# Patient Record
Sex: Female | Born: 1955 | Race: Black or African American | Hispanic: No | Smoking: Former smoker
Health system: Southern US, Community
[De-identification: ages and names within clinical notes are randomized; demographics above are authoritative.]

## PROBLEM LIST (undated history)

## (undated) DIAGNOSIS — I10 Essential (primary) hypertension: Secondary | ICD-10-CM

## (undated) DIAGNOSIS — E119 Type 2 diabetes mellitus without complications: Secondary | ICD-10-CM

## (undated) HISTORY — DX: Type 2 diabetes mellitus without complications: E11.9

## (undated) HISTORY — DX: Essential (primary) hypertension: I10

---

## 2011-03-24 HISTORY — PX: GALLBLADDER SURGERY: SHX652

## 2016-06-16 DIAGNOSIS — IMO0001 Reserved for inherently not codable concepts without codable children: Secondary | ICD-10-CM | POA: Insufficient documentation

## 2020-05-03 ENCOUNTER — Ambulatory Visit (INDEPENDENT_AMBULATORY_CARE_PROVIDER_SITE_OTHER): Payer: Self-pay | Admitting: Physician Assistant

## 2020-05-03 ENCOUNTER — Other Ambulatory Visit: Payer: Self-pay

## 2020-05-03 ENCOUNTER — Encounter: Payer: Self-pay | Admitting: Physician Assistant

## 2020-05-03 VITALS — BP 136/86 | HR 90 | Temp 97.9°F | Resp 16 | Ht 64.5 in | Wt 150.4 lb

## 2020-05-03 DIAGNOSIS — E119 Type 2 diabetes mellitus without complications: Secondary | ICD-10-CM

## 2020-05-03 DIAGNOSIS — E2839 Other primary ovarian failure: Secondary | ICD-10-CM | POA: Diagnosis not present

## 2020-05-03 DIAGNOSIS — R5383 Other fatigue: Secondary | ICD-10-CM | POA: Diagnosis not present

## 2020-05-03 DIAGNOSIS — M25511 Pain in right shoulder: Secondary | ICD-10-CM | POA: Diagnosis not present

## 2020-05-03 DIAGNOSIS — Z1231 Encounter for screening mammogram for malignant neoplasm of breast: Secondary | ICD-10-CM | POA: Diagnosis not present

## 2020-05-03 DIAGNOSIS — Z1211 Encounter for screening for malignant neoplasm of colon: Secondary | ICD-10-CM

## 2020-05-03 DIAGNOSIS — Z7689 Persons encountering health services in other specified circumstances: Secondary | ICD-10-CM

## 2020-05-03 DIAGNOSIS — I10 Essential (primary) hypertension: Secondary | ICD-10-CM | POA: Diagnosis not present

## 2020-05-03 DIAGNOSIS — Z13820 Encounter for screening for osteoporosis: Secondary | ICD-10-CM

## 2020-05-03 LAB — POCT GLYCOSYLATED HEMOGLOBIN (HGB A1C): Hemoglobin A1C: 6 % — AB (ref 4.0–5.6)

## 2020-05-03 MED ORDER — MELOXICAM 15 MG PO TABS: 15.0000 mg | ORAL_TABLET | Freq: Every day | ORAL | 0 refills | Status: DC

## 2020-05-03 NOTE — Progress Notes (Signed)
[facility] [address] [place], [place] [address]  Internal MEDICINE  Office Visit Note  Patient Name: Adrienne Brown  [id number]  [id number]  Date of Service: 05/09/2020   Complaints/HPI Pt is here for establishment of PCP. Chief Complaint  Patient presents with  . New Patient (Initial Visit)  . Medication Refill  . Shoulder Pain    Right shoulder started after she got her flu shout.  For a few months  . sleep issues    Due to her shoulder pain   HPI Pt is here today to establish care. She moved from [place] recently and changed insurance so needed a new PCP. She has a history of diabetes and HTN and is a former smoker who quit in 2006. She takes 1000mg  Metformin once per day since 2017, and does well on it. She had high blood pressure when she smoked and was stopped on BP meds when she stopped smoking in 2006. She was then started on lisinopril in 2017 when she was diagnosed with DM as a protective measure and her BP remains stable on it.  She is also having R shoulder pain. She had flu shot in R arm in November and shortly after noticed some pain. She is unsure if these events are related because she also moved from [place], and was lifting things to move. She also works out several times per week and thinks this may be aggravating it. The pain keeps her up at night sometimes and she cant sleep on it. She has limited ROM and can't reach up or back without pain. She feels like it may be getting worse. She has been using icy-hot and ice packs. She has not tried a heating pad or any tylenol or ibuprofen.  She has not had a colonoscopy in over 10 years, and needs a mammogram and BMD as well. Vitals reviewed and pulse noticed to be a little high. She says she gets nervous in doctor's offices.  Current Medication: Outpatient Encounter Medications as of 05/03/2020  Medication Sig  . glucose blood (PRECISION QID TEST) test strip Check sugar once daily  . Lancets MISC  Check sugar once daily  . lisinopril (ZESTRIL) 10 MG tablet Take 10 mg by mouth daily. One tablet by mouth daily  . meloxicam (MOBIC) 15 MG tablet Take 1 tablet (15 mg total) by mouth daily.  . metFORMIN (GLUCOPHAGE) 1000 MG tablet Take 1,000 mg by mouth daily with breakfast. Take one tablet daily with meal   No facility-administered encounter medications on file as of 05/03/2020.    Surgical History: Past Surgical History:  Procedure Laterality Date  . GALLBLADDER SURGERY  2013    Medical History: Past Medical History:  Diagnosis Date  . Hypertension   . Type II diabetes mellitus (HCC)     Family History: Family History  Problem Relation Age of Onset  . Hyperthyroidism Mother        84 yrs old, smokes cigs and alcohol  . Hypertension Mother   . Diabetes Mother   . Diabetes Father   . Intestinal malrotation Father        intestinal blockage  . Dementia Father     Social History   Socioeconomic History  . Marital status: Unknown    Spouse name: Not on file  . Number of children: Not on file  . Years of education: Not on file  . Highest education level: Not on file  Occupational History  . Not on file  Tobacco Use  . Smoking status: Former Smoker    Types: Cigarettes    Quit date: 03/23/2004    Years since quitting: 16.1  . Smokeless tobacco: Never Used  . Tobacco comment: does nicorett at times  Substance and Sexual Activity  . Alcohol use: Yes    Alcohol/week: 2.0 standard drinks    Types: 2 Glasses of wine per week  . Drug use: Never  . Sexual activity: Not on file  Other Topics Concern  . Not on file  Social History Narrative  . Not on file   Social Determinants of Health   Financial Resource Strain: Not on file  Food Insecurity: Not on file  Transportation Needs: Not on file  Physical Activity: Not on file  Stress: Not on file  Social Connections: Not on file  Intimate Partner Violence: Not on file     Review of Systems  Constitutional:  Positive for fatigue. Negative for chills and unexpected weight change.  HENT: Negative for congestion, postnasal drip, rhinorrhea, sneezing and sore throat.   Eyes: Negative for redness.  Respiratory: Negative for cough, chest tightness and shortness of breath.   Cardiovascular: Negative for chest pain and palpitations.  Gastrointestinal: Negative for abdominal pain, constipation, diarrhea, nausea and vomiting.  Genitourinary: Negative for dysuria and frequency.  Musculoskeletal: Positive for arthralgias. Negative for back pain, joint swelling and neck pain.       R shoulder pain when sleeping on that side, reaching overhead or backwards  Skin: Negative for rash.  Neurological: Negative.  Negative for tremors and numbness.  Hematological: Negative for adenopathy. Does not bruise/bleed easily.  Psychiatric/Behavioral: Positive for sleep disturbance. Negative for behavioral problems (Depression) and suicidal ideas. The patient is nervous/anxious.        Sleep disturbance is only if she sleeps on R shoulder    Vital Signs: BP 136/86   Pulse 90   Temp 97.9 F (36.6 C)   Resp 16   Ht 5' 4.5" (1.638 m)   Wt 150 lb 6.4 oz (68.2 kg)   SpO2 97%   BMI 25.42 kg/m    Physical Exam Constitutional:      General: She is not in acute distress.    Appearance: She is well-developed. She is not diaphoretic.  HENT:     Head: Normocephalic and atraumatic.     Mouth/Throat:     Pharynx: No oropharyngeal exudate.  Eyes:     Pupils: Pupils are equal, round, and reactive to light.  Neck:     Thyroid: No thyromegaly.     Vascular: No JVD.     Trachea: No tracheal deviation.  Cardiovascular:     Rate and Rhythm: Normal rate and regular rhythm.     Heart sounds: Normal heart sounds. No murmur heard. No friction rub. No gallop.   Pulmonary:     Effort: Pulmonary effort is normal. No respiratory distress.     Breath sounds: No wheezing or rales.  Chest:     Chest wall: No tenderness.   Abdominal:     General: Bowel sounds are normal.     Palpations: Abdomen is soft.  Musculoskeletal:     Cervical back: Normal range of motion and neck supple.     Comments: Limited ROM of R shoulder secondary to pain. Pain elicited with overhead reach and backwards reach. Negative empty can test. Non TTP.  Lymphadenopathy:     Cervical: No cervical adenopathy.  Skin:    General: Skin is warm and dry.  Neurological:     General: No focal deficit present.     Mental Status: She is alert and oriented to person, place, and time.     Cranial Nerves: No cranial nerve deficit.  Psychiatric:        Behavior: Behavior normal.        Thought Content: Thought content normal.        Judgment: Judgment normal.       Assessment/Plan: 1. Controlled type 2 diabetes mellitus without complication, without long-term current use of insulin (HCC) - POCT glycosylated hemoglobin (Hb A1C) is 6.0 today. She is well controled on current medication. She will continue Metformin 1000mg  daily.   2. Essential hypertension Stable. Continue Lisinopril 10mg  daily. Continue to monitor BP.  3. Acute pain of right shoulder She will continue icy-hot and ice packs as needed. She will also limit lifting and upper body work outs that may aggravate shoulder. She will try Mobic to reduce any underling inflammation. Will consider imaging and possible ortho referral if not improving with conservative measures. - meloxicam (MOBIC) 15 MG tablet; Take 1 tablet (15 mg total) by mouth daily.  Dispense: 30 tablet; Refill: 0  4. Screening for colon cancer - Ambulatory referral to Gastroenterology  5. Screening mammogram for breast cancer - MM DIGITAL SCREENING BILATERAL; Future  6. Encounter for screening for osteoporosis - DG Bone Density; Future  7. Encounter to establish care with new doctor Ordered age appropriate labs to be completed prior to physical. - CBC with Differential/Platelet; Future - Lipid Panel With  LDL/HDL Ratio; Future - TSH; Future - T4, free; Future - Comprehensive metabolic panel - Vitamin D (25 hydroxy)  8. Fatigue, unspecified type - CBC with Differential/Platelet; Future   General Counseling: Marium verbalizes understanding of the findings of todays visit and agrees with plan of treatment. I have discussed any further diagnostic evaluation that may be needed or ordered today. We also reviewed her medications today. she has been encouraged to call the office with any questions or concerns that should arise related to todays visit.    Orders Placed This Encounter  Procedures  . MM DIGITAL SCREENING BILATERAL  . DG Bone Density  . CBC with Differential/Platelet  . Lipid Panel With LDL/HDL Ratio  . TSH  . T4, free  . Comprehensive metabolic panel  . Vitamin D (25 hydroxy)  . Ambulatory referral to Gastroenterology  . POCT glycosylated hemoglobin (Hb A1C)    Meds ordered this encounter  Medications  . meloxicam (MOBIC) 15 MG tablet    Sig: Take 1 tablet (15 mg total) by mouth daily.    Dispense:  30 tablet    Refill:  0    Time spent:30 Minutes

## 2020-05-09 ENCOUNTER — Encounter: Payer: Self-pay | Admitting: Physician Assistant

## 2020-05-17 ENCOUNTER — Encounter: Payer: Self-pay | Admitting: *Deleted

## 2020-05-21 ENCOUNTER — Other Ambulatory Visit: Payer: Self-pay | Admitting: Physician Assistant

## 2020-05-21 DIAGNOSIS — R5383 Other fatigue: Secondary | ICD-10-CM | POA: Diagnosis not present

## 2020-05-21 DIAGNOSIS — E756 Lipid storage disorder, unspecified: Secondary | ICD-10-CM | POA: Diagnosis not present

## 2020-05-21 DIAGNOSIS — Z7689 Persons encountering health services in other specified circumstances: Secondary | ICD-10-CM | POA: Diagnosis not present

## 2020-05-21 DIAGNOSIS — Z1231 Encounter for screening mammogram for malignant neoplasm of breast: Secondary | ICD-10-CM

## 2020-05-22 LAB — T4, FREE: Free T4: 1.24 ng/dL (ref 0.82–1.77)

## 2020-05-22 LAB — CBC WITH DIFFERENTIAL/PLATELET
Basophils Absolute: 0.1 10*3/uL (ref 0.0–0.2)
Basos: 2 %
EOS (ABSOLUTE): 0.2 10*3/uL (ref 0.0–0.4)
Eos: 4 %
Hematocrit: 39.2 % (ref 34.0–46.6)
Hemoglobin: 13.1 g/dL (ref 11.1–15.9)
Immature Grans (Abs): 0 10*3/uL (ref 0.0–0.1)
Immature Granulocytes: 0 %
Lymphocytes Absolute: 2.3 10*3/uL (ref 0.7–3.1)
Lymphs: 49 %
MCH: 29.2 pg (ref 26.6–33.0)
MCHC: 33.4 g/dL (ref 31.5–35.7)
MCV: 88 fL (ref 79–97)
Monocytes Absolute: 0.5 10*3/uL (ref 0.1–0.9)
Monocytes: 10 %
Neutrophils Absolute: 1.6 10*3/uL (ref 1.4–7.0)
Neutrophils: 35 %
Platelets: 255 10*3/uL (ref 150–450)
RBC: 4.48 x10E6/uL (ref 3.77–5.28)
RDW: 13.3 % (ref 11.7–15.4)
WBC: 4.7 10*3/uL (ref 3.4–10.8)

## 2020-05-22 LAB — LIPID PANEL WITH LDL/HDL RATIO
Cholesterol, Total: 182 mg/dL (ref 100–199)
HDL: 45 mg/dL (ref >39)
LDL Chol Calc (NIH): 114 mg/dL — ABNORMAL HIGH (ref 0–99)
LDL/HDL Ratio: 2.5 ratio (ref 0.0–3.2)
Triglycerides: 129 mg/dL (ref 0–149)
VLDL Cholesterol Cal: 23 mg/dL (ref 5–40)

## 2020-05-22 LAB — TSH: TSH: 3.07 u[IU]/mL (ref 0.450–4.500)

## 2020-05-22 LAB — COMPREHENSIVE METABOLIC PANEL
ALT: 40 IU/L — ABNORMAL HIGH (ref 0–32)
AST: 47 IU/L — ABNORMAL HIGH (ref 0–40)
Albumin/Globulin Ratio: 1.5 (ref 1.2–2.2)
Albumin: 4.5 g/dL (ref 3.8–4.8)
Alkaline Phosphatase: 48 IU/L (ref 44–121)
BUN/Creatinine Ratio: 14 (ref 12–28)
BUN: 9 mg/dL (ref 8–27)
Bilirubin Total: 0.2 mg/dL (ref 0.0–1.2)
CO2: 20 mmol/L (ref 20–29)
Calcium: 9 mg/dL (ref 8.7–10.3)
Chloride: 104 mmol/L (ref 96–106)
Creatinine, Ser: 0.65 mg/dL (ref 0.57–1.00)
Globulin, Total: 3 g/dL (ref 1.5–4.5)
Glucose: 133 mg/dL — ABNORMAL HIGH (ref 65–99)
Potassium: 4.3 mmol/L (ref 3.5–5.2)
Sodium: 142 mmol/L (ref 134–144)
Total Protein: 7.5 g/dL (ref 6.0–8.5)
eGFR: 98 mL/min/{1.73_m2} (ref >59)

## 2020-05-22 LAB — VITAMIN D 25 HYDROXY (VIT D DEFICIENCY, FRACTURES): Vit D, 25-Hydroxy: 10.2 ng/mL — ABNORMAL LOW (ref 30.0–100.0)

## 2020-05-30 ENCOUNTER — Other Ambulatory Visit: Payer: Self-pay | Admitting: Physician Assistant

## 2020-05-30 DIAGNOSIS — E2839 Other primary ovarian failure: Secondary | ICD-10-CM

## 2020-05-30 DIAGNOSIS — Z7689 Persons encountering health services in other specified circumstances: Secondary | ICD-10-CM

## 2020-05-30 DIAGNOSIS — Z1211 Encounter for screening for malignant neoplasm of colon: Secondary | ICD-10-CM

## 2020-05-30 DIAGNOSIS — M25511 Pain in right shoulder: Secondary | ICD-10-CM

## 2020-05-30 DIAGNOSIS — I10 Essential (primary) hypertension: Secondary | ICD-10-CM

## 2020-05-30 DIAGNOSIS — R5383 Other fatigue: Secondary | ICD-10-CM

## 2020-05-30 DIAGNOSIS — Z1231 Encounter for screening mammogram for malignant neoplasm of breast: Secondary | ICD-10-CM

## 2020-05-30 DIAGNOSIS — E119 Type 2 diabetes mellitus without complications: Secondary | ICD-10-CM

## 2020-06-11 ENCOUNTER — Ambulatory Visit
Admission: RE | Admit: 2020-06-11 | Discharge: 2020-06-11 | Disposition: A | Discharge status: Home / Self Care | Payer: Medicare HMO | Source: Ambulatory Visit | Attending: Physician Assistant | Admitting: Physician Assistant

## 2020-06-11 ENCOUNTER — Other Ambulatory Visit: Payer: Self-pay

## 2020-06-11 DIAGNOSIS — E119 Type 2 diabetes mellitus without complications: Secondary | ICD-10-CM | POA: Diagnosis present

## 2020-06-11 DIAGNOSIS — L728 Other follicular cysts of the skin and subcutaneous tissue: Secondary | ICD-10-CM | POA: Diagnosis not present

## 2020-06-11 DIAGNOSIS — E2839 Other primary ovarian failure: Secondary | ICD-10-CM | POA: Insufficient documentation

## 2020-06-11 DIAGNOSIS — Z780 Asymptomatic menopausal state: Secondary | ICD-10-CM | POA: Diagnosis not present

## 2020-06-11 DIAGNOSIS — R5383 Other fatigue: Secondary | ICD-10-CM | POA: Diagnosis present

## 2020-06-11 DIAGNOSIS — Z1211 Encounter for screening for malignant neoplasm of colon: Secondary | ICD-10-CM | POA: Insufficient documentation

## 2020-06-11 DIAGNOSIS — Z1231 Encounter for screening mammogram for malignant neoplasm of breast: Secondary | ICD-10-CM | POA: Diagnosis present

## 2020-06-11 DIAGNOSIS — M25511 Pain in right shoulder: Secondary | ICD-10-CM | POA: Insufficient documentation

## 2020-06-11 DIAGNOSIS — Z7689 Persons encountering health services in other specified circumstances: Secondary | ICD-10-CM | POA: Diagnosis present

## 2020-06-11 DIAGNOSIS — I10 Essential (primary) hypertension: Secondary | ICD-10-CM | POA: Insufficient documentation

## 2020-06-11 DIAGNOSIS — M85851 Other specified disorders of bone density and structure, right thigh: Secondary | ICD-10-CM | POA: Diagnosis not present

## 2020-06-20 ENCOUNTER — Ambulatory Visit (INDEPENDENT_AMBULATORY_CARE_PROVIDER_SITE_OTHER): Payer: Self-pay | Admitting: Physician Assistant

## 2020-06-20 ENCOUNTER — Other Ambulatory Visit: Payer: Self-pay

## 2020-06-20 ENCOUNTER — Encounter: Payer: Self-pay | Admitting: Physician Assistant

## 2020-06-20 DIAGNOSIS — R7989 Other specified abnormal findings of blood chemistry: Secondary | ICD-10-CM | POA: Diagnosis not present

## 2020-06-20 DIAGNOSIS — M25511 Pain in right shoulder: Secondary | ICD-10-CM | POA: Diagnosis not present

## 2020-06-20 DIAGNOSIS — E1159 Type 2 diabetes mellitus with other circulatory complications: Secondary | ICD-10-CM

## 2020-06-20 DIAGNOSIS — Z0001 Encounter for general adult medical examination with abnormal findings: Secondary | ICD-10-CM

## 2020-06-20 DIAGNOSIS — E559 Vitamin D deficiency, unspecified: Secondary | ICD-10-CM

## 2020-06-20 DIAGNOSIS — I10 Essential (primary) hypertension: Secondary | ICD-10-CM | POA: Diagnosis not present

## 2020-06-20 DIAGNOSIS — R300 Dysuria: Secondary | ICD-10-CM

## 2020-06-20 DIAGNOSIS — M8580 Other specified disorders of bone density and structure, unspecified site: Secondary | ICD-10-CM | POA: Diagnosis not present

## 2020-06-20 MED ORDER — ERGOCALCIFEROL 1.25 MG (50000 UT) PO CAPS
50000.0000 [IU] | ORAL_CAPSULE | ORAL | 2 refills | Status: DC
Start: 2020-06-20 — End: 2020-09-19

## 2020-06-20 MED ORDER — METFORMIN HCL 1000 MG PO TABS: 1000.0000 mg | ORAL_TABLET | Freq: Every day | ORAL | 2 refills | Status: DC

## 2020-06-20 MED ORDER — LISINOPRIL 10 MG PO TABS: 10.0000 mg | ORAL_TABLET | Freq: Every day | ORAL | 2 refills | Status: DC

## 2020-06-20 NOTE — Progress Notes (Signed)
[facility] [place], [place] [address]  Internal MEDICINE  Office Visit Note  Patient Name: Adrienne Brown  [id number]  [id number]  Date of Service: 06/20/2020  Chief Complaint  Patient presents with  . Annual Exam  . Diabetes    6 week fup, review labs  . Shoulder Pain    Pt having pain and problems sleeping at night due to pain  . Quality Metric Gaps    Hep C screen, HIV screen, Colonoscopy, PNA vaccine, Pap smear     HPI Pt is here for routine health maintenance examination -She had her mammogram done last week and her BMD showed osteopenia. Her colonoscopy has not been done yet but GI referall was placed last visit for this. -reviewed labs showing elevated LFTs--discussed we need to monitor this and repeat labs before next visit as well as check ferritin levels. Her vitamin D was also very low and will need to be started on a supplement. LDL slightly elevated pt will work on diet/exercise and hold off starting statin especially since LFTs elevated.  -R arm pain still present--worse when she sleeps at night. She tried the mobic but it didn't really help and she doesn't like to take meds so she stopped this. She cannot raise arm all the way or extend back completely. Concern for possible rotator cuff injury. Discussed options of imaging and ortho referral, pt interested in seeing ortho so they can pursue imaging directly if needed. She has also altered her exercise routine to limit strain on shoulder. -BP high today due to nerves and reports she drank coffee right before visit which she doesn't always do. This could explain her elevated HR on intake as well. She does not like coming in to the doctor offices. She has not checked her BP at hom e recently, but will start to check more regularly. BP recheck in office 140/86 -Eye exam needs to be scheduled. -BG not checked at home   Current Medication: Outpatient Encounter Medications as of 06/20/2020   Medication Sig  . ergocalciferol (VITAMIN D2) 1.25 MG (50000 UT) capsule Take 1 capsule (50,000 Units total) by mouth once a week.  Marland Kitchen glucose blood (PRECISION QID TEST) test strip Check sugar once daily  . Lancets MISC Check sugar once daily  . [DISCONTINUED] lisinopril (ZESTRIL) 10 MG tablet Take 10 mg by mouth daily. One tablet by mouth daily  . [DISCONTINUED] metFORMIN (GLUCOPHAGE) 1000 MG tablet Take 1,000 mg by mouth daily with breakfast. Take one tablet daily with meal  . lisinopril (ZESTRIL) 10 MG tablet Take 1 tablet (10 mg total) by mouth daily. One tablet by mouth daily  . meloxicam (MOBIC) 15 MG tablet TAKE 1 TABLET (15 MG TOTAL) BY MOUTH DAILY. (Patient not taking: Reported on 06/20/2020)  . metFORMIN (GLUCOPHAGE) 1000 MG tablet Take 1 tablet (1,000 mg total) by mouth daily with breakfast. Take one tablet daily with meal   No facility-administered encounter medications on file as of 06/20/2020.    Surgical History: Past Surgical History:  Procedure Laterality Date  . GALLBLADDER SURGERY  2013    Medical History: Past Medical History:  Diagnosis Date  . Hypertension   . Type II diabetes mellitus (HCC)     Family History: Family History  Problem Relation Age of Onset  . Hyperthyroidism Mother        80 yrs old, smokes cigs and alcohol  . Hypertension Mother   . Diabetes Mother   . Diabetes Father   .  Intestinal malrotation Father        intestinal blockage  . Dementia Father       Review of Systems  Constitutional: Negative for chills, fatigue and unexpected weight change.  HENT: Negative for congestion, rhinorrhea, sneezing and sore throat.   Eyes: Negative for redness.  Respiratory: Negative for cough, chest tightness and shortness of breath.   Cardiovascular: Negative for chest pain and palpitations.  Gastrointestinal: Negative for abdominal pain, constipation, diarrhea, nausea and vomiting.  Genitourinary: Negative for dysuria and frequency.   Musculoskeletal: Positive for arthralgias. Negative for back pain, joint swelling and neck pain.       R shoulder pain  Skin: Negative for rash.  Neurological: Negative.  Negative for tremors and numbness.  Hematological: Negative for adenopathy. Does not bruise/bleed easily.  Psychiatric/Behavioral: Positive for sleep disturbance. Negative for behavioral problems (Depression) and suicidal ideas. The patient is nervous/anxious.      Vital Signs: BP (!) 150/92   Pulse 99   Temp 98.4 F (36.9 C)   Resp 16   Ht 5' 4.5" (1.638 m)   Wt 151 lb 9.6 oz (68.8 kg)   SpO2 99%   BMI 25.62 kg/m    Physical Exam Vitals and nursing note reviewed.  Constitutional:      General: She is not in acute distress.    Appearance: She is well-developed. She is not diaphoretic.  HENT:     Head: Normocephalic and atraumatic.     Right Ear: External ear normal.     Left Ear: External ear normal.     Nose: Nose normal.     Mouth/Throat:     Pharynx: No oropharyngeal exudate.  Eyes:     General: No scleral icterus.       Right eye: No discharge.        Left eye: No discharge.     Conjunctiva/sclera: Conjunctivae normal.     Pupils: Pupils are equal, round, and reactive to light.  Neck:     Thyroid: No thyromegaly.     Vascular: No JVD.     Trachea: No tracheal deviation.  Cardiovascular:     Rate and Rhythm: Normal rate and regular rhythm.     Heart sounds: Normal heart sounds. No murmur heard. No friction rub. No gallop.   Pulmonary:     Effort: Pulmonary effort is normal. No respiratory distress.     Breath sounds: Normal breath sounds. No stridor. No wheezing or rales.  Chest:     Chest wall: No tenderness.     Comments: Denied breast exam since she just had normal mammogram Abdominal:     General: Bowel sounds are normal. There is no distension.     Palpations: Abdomen is soft. There is no mass.     Tenderness: There is no abdominal tenderness. There is no guarding or rebound.   Musculoskeletal:        General: Tenderness present. No deformity.     Cervical back: Normal range of motion and neck supple.     Right lower leg: No edema.     Left lower leg: No edema.     Comments: Pain in R shoulder which limits ROM, especially painful at night  Lymphadenopathy:     Cervical: No cervical adenopathy.  Skin:    General: Skin is warm and dry.     Coloration: Skin is not pale.     Findings: No erythema or rash.  Neurological:     Mental Status: She is  alert and oriented to person, place, and time.     Cranial Nerves: No cranial nerve deficit.     Motor: No abnormal muscle tone.     Coordination: Coordination normal.     Deep Tendon Reflexes: Reflexes are normal and symmetric.  Psychiatric:        Behavior: Behavior normal.        Thought Content: Thought content normal.        Judgment: Judgment normal.      LABS: Recent Results (from the past 2160 hour(s))  POCT glycosylated hemoglobin (Hb A1C)     Status: Abnormal   Collection Time: 05/03/20  9:15 AM  Result Value Ref Range   Hemoglobin A1C 6.0 (A) 4.0 - 5.6 %   HbA1c POC (<> result, manual entry)     HbA1c, POC (prediabetic range)     HbA1c, POC (controlled diabetic range)    CBC with Differential/Platelet     Status: None   Collection Time: 05/21/20 11:05 AM  Result Value Ref Range   WBC 4.7 3.4 - 10.8 x10E3/uL   RBC 4.48 3.77 - 5.28 x10E6/uL   Hemoglobin 13.1 11.1 - 15.9 g/dL   Hematocrit 39.2 34.0 - 46.6 %   MCV 88 79 - 97 fL   MCH 29.2 26.6 - 33.0 pg   MCHC 33.4 31.5 - 35.7 g/dL   RDW 13.3 11.7 - 15.4 %   Platelets 255 150 - 450 x10E3/uL   Neutrophils 35 Not Estab. %   Lymphs 49 Not Estab. %   Monocytes 10 Not Estab. %   Eos 4 Not Estab. %   Basos 2 Not Estab. %   Neutrophils Absolute 1.6 1.4 - 7.0 x10E3/uL   Lymphocytes Absolute 2.3 0.7 - 3.1 x10E3/uL   Monocytes Absolute 0.5 0.1 - 0.9 x10E3/uL   EOS (ABSOLUTE) 0.2 0.0 - 0.4 x10E3/uL   Basophils Absolute 0.1 0.0 - 0.2 x10E3/uL    Immature Granulocytes 0 Not Estab. %   Immature Grans (Abs) 0.0 0.0 - 0.1 x10E3/uL  T4, free     Status: None   Collection Time: 05/21/20 11:06 AM  Result Value Ref Range   Free T4 1.24 0.82 - 1.77 ng/dL  Lipid Panel With LDL/HDL Ratio     Status: Abnormal   Collection Time: 05/21/20 11:06 AM  Result Value Ref Range   Cholesterol, Total 182 100 - 199 mg/dL   Triglycerides 129 0 - 149 mg/dL   HDL 45 >39 mg/dL   VLDL Cholesterol Cal 23 5 - 40 mg/dL   LDL Chol Calc (NIH) 114 (H) 0 - 99 mg/dL   LDL/HDL Ratio 2.5 0.0 - 3.2 ratio    Comment:                                     LDL/HDL Ratio                                             Men  Women                               1/2 Avg.Risk  1.0    1.5  Avg.Risk  3.6    3.2                                2X Avg.Risk  6.2    5.0                                3X Avg.Risk  8.0    6.1   Comprehensive metabolic panel     Status: Abnormal   Collection Time: 05/21/20 11:07 AM  Result Value Ref Range   Glucose 133 (H) 65 - 99 mg/dL   BUN 9 8 - 27 mg/dL   Creatinine, Ser 0.65 0.57 - 1.00 mg/dL   eGFR 98 >59 mL/min/1.73    Comment: **In accordance with recommendations from the NKF-ASN Task force,**   [facility] has updated its eGFR calculation to the 2021 CKD-EPI   creatinine equation that estimates kidney function without a race   variable.    BUN/Creatinine Ratio 14 12 - 28   Sodium 142 134 - 144 mmol/L   Potassium 4.3 3.5 - 5.2 mmol/L   Chloride 104 96 - 106 mmol/L   CO2 20 20 - 29 mmol/L   Calcium 9.0 8.7 - 10.3 mg/dL   Total Protein 7.5 6.0 - 8.5 g/dL   Albumin 4.5 3.8 - 4.8 g/dL   Globulin, Total 3.0 1.5 - 4.5 g/dL   Albumin/Globulin Ratio 1.5 1.2 - 2.2   Bilirubin Total 0.2 0.0 - 1.2 mg/dL   Alkaline Phosphatase 48 44 - 121 IU/L   AST 47 (H) 0 - 40 IU/L   ALT 40 (H) 0 - 32 IU/L  Vitamin D (25 hydroxy)     Status: Abnormal   Collection Time: 05/21/20 11:07 AM  Result Value Ref Range   Vit D,  25-Hydroxy 10.2 (L) 30.0 - 100.0 ng/mL    Comment: Vitamin D deficiency has been defined by the Institute of Medicine and an Endocrine Society practice guideline as a level of serum 25-OH vitamin D less than 20 ng/mL (1,2). The Endocrine Society went on to further define vitamin D insufficiency as a level between 21 and 29 ng/mL (2). 1. IOM (Institute of Medicine). 2010. Dietary reference    intakes for calcium and D. [place]: The    [employer]. 2. Holick MF, Binkley [place], Bischoff-Ferrari HA, et al.    Evaluation, treatment, and prevention of vitamin D    deficiency: an Endocrine Society clinical practice    guideline. JCEM. 2011 Jul; 96(7):1911-30.   TSH     Status: None   Collection Time: 05/21/20 11:07 AM  Result Value Ref Range   TSH 3.070 0.450 - 4.500 uIU/mL        Assessment/Plan: 1. Encounter for general adult medical examination with abnormal findings Mammogram normal, BMD showed osteopenia, colonoscopy will be scheduled, labs reviewed  2. Essential hypertension Elevated, but improved on recheck. Pt will log BP at home and continue on lisinopril. May need to consider BB next visit if HR and BP still elevated. - lisinopril (ZESTRIL) 10 MG tablet; Take 1 tablet (10 mg total) by mouth daily. One tablet by mouth daily  Dispense: 30 tablet; Refill: 2  3. Acute pain of right shoulder Pain is still ongoing, especially at night. Pt tried mobic and limiting strenuous activity, without improvement. Will refer to orthopedics for possible imaging and further evaluation. - AMB referral  to orthopedics  4. Controlled type 2 diabetes mellitus with other circulatory complication, without long-term current use of insulin (HCC) Stable, continue metformin. Foot exam done today. - metFORMIN (GLUCOPHAGE) 1000 MG tablet; Take 1 tablet (1,000 mg total) by mouth daily with breakfast. Take one tablet daily with meal  Dispense: 30 tablet; Refill: 2  5. Osteopenia, unspecified  location Found on BMD, will start on drisdol to maximize vitamin D supplement and pt will also supplement with calcium. - ergocalciferol (VITAMIN D2) 1.25 MG (50000 UT) capsule; Take 1 capsule (50,000 Units total) by mouth once a week.  Dispense: 4 capsule; Refill: 2  6. Vitamin D deficiency Start on drisdol weekly. - ergocalciferol (VITAMIN D2) 1.25 MG (50000 UT) capsule; Take 1 capsule (50,000 Units total) by mouth once a week.  Dispense: 4 capsule; Refill: 2  7. Elevated LFTs Mildly elevated, will repeat labs and check ferritin before next visit and if still elevated consider abdominal US - Hepatic function panel - Fe+TIBC+Fer  8. Dysuria - UA/M w/rflx Culture, Routine   General Counseling: Demetrius verbalizes understanding of the findings of todays visit and agrees with plan of treatment. I have discussed any further diagnostic evaluation that may be needed or ordered today. We also reviewed her medications today. she has been encouraged to call the office with any questions or concerns that should arise related to todays visit.    Counseling: Hypertension Counseling:   The following hypertensive lifestyle modification were recommended and discussed:  1. Limiting alcohol intake to less than 1 oz/day of ethanol:(24 oz of beer or 8 oz of wine or 2 oz of 100-proof whiskey). 2. Take baby ASA 81 mg daily. 3. Importance of regular aerobic exercise and losing weight. 4. Reduce dietary saturated fat and cholesterol intake for overall cardiovascular health. 5. Maintaining adequate dietary potassium, calcium, and magnesium intake. 6. Regular monitoring of the blood pressure. 7. Reduce sodium intake to less than 100 mmol/day (less than 2.3 gm of sodium or less than 6 gm of sodium choride)    Orders Placed This Encounter  Procedures  . UA/M w/rflx Culture, Routine  . Hepatic function panel  . Fe+TIBC+Fer  . AMB referral to orthopedics    Meds ordered this encounter  Medications  .  lisinopril (ZESTRIL) 10 MG tablet    Sig: Take 1 tablet (10 mg total) by mouth daily. One tablet by mouth daily    Dispense:  30 tablet    Refill:  2  . metFORMIN (GLUCOPHAGE) 1000 MG tablet    Sig: Take 1 tablet (1,000 mg total) by mouth daily with breakfast. Take one tablet daily with meal    Dispense:  30 tablet    Refill:  2  . ergocalciferol (VITAMIN D2) 1.25 MG (50000 UT) capsule    Sig: Take 1 capsule (50,000 Units total) by mouth once a week.    Dispense:  4 capsule    Refill:  2    This patient was seen by Drema Dallas, PA-C in collaboration with Dr. Clayborn Bigness as a part of collaborative care agreement.  Total time spent:30 Minutes  Time spent includes review of chart, medications, test results, and follow up plan with the patient.     Lavera Guise, MD  Internal Medicine

## 2020-06-25 LAB — UA/M W/RFLX CULTURE, ROUTINE
Bilirubin, UA: NEGATIVE
Glucose, UA: NEGATIVE
Ketones, UA: NEGATIVE
Nitrite, UA: NEGATIVE
RBC, UA: NEGATIVE
Specific Gravity, UA: 1.023 (ref 1.005–1.030)
Urobilinogen, Ur: 1 mg/dL (ref 0.2–1.0)
pH, UA: 6.5 (ref 5.0–7.5)

## 2020-06-25 LAB — MICROSCOPIC EXAMINATION
Casts: NONE SEEN /lpf
Epithelial Cells (non renal): >10 /hpf — AB (ref 0–10)
RBC, Urine: NONE SEEN /hpf (ref 0–2)

## 2020-06-25 LAB — URINE CULTURE, REFLEX

## 2020-06-26 ENCOUNTER — Other Ambulatory Visit: Payer: Self-pay | Admitting: Physician Assistant

## 2020-06-26 ENCOUNTER — Telehealth: Payer: Self-pay

## 2020-06-26 DIAGNOSIS — N3000 Acute cystitis without hematuria: Secondary | ICD-10-CM

## 2020-06-26 MED ORDER — NITROFURANTOIN MONOHYD MACRO 100 MG PO CAPS: ORAL_CAPSULE | ORAL | 0 refills | Status: DC

## 2020-06-26 NOTE — Telephone Encounter (Signed)
LMOM  To inform pt to pick up meds at pharmacy and call back for results, pt needs to know she has a UTI.

## 2020-06-26 NOTE — Telephone Encounter (Signed)
-----   Message from Carlean Jews, PA-C sent at 06/26/2020  8:19 AM EDT ----- Please let pt know she has a UTI and I sent ABX for her.

## 2020-06-28 ENCOUNTER — Telehealth: Payer: Self-pay

## 2020-06-28 NOTE — Telephone Encounter (Signed)
LMOM  to make sure pt picked up meds at pharmacy and to call back, pt needs to know she has a UTI.

## 2020-07-03 ENCOUNTER — Telehealth: Payer: Self-pay

## 2020-07-03 NOTE — Telephone Encounter (Signed)
Spoke with pt, she is almost done with ABX for UTI. Pt also wants to know more about referral for her shoulder and any possible testing that needs to be done.

## 2020-07-10 DIAGNOSIS — H524 Presbyopia: Secondary | ICD-10-CM | POA: Diagnosis not present

## 2020-07-10 DIAGNOSIS — Z0100 Encounter for examination of eyes and vision without abnormal findings: Secondary | ICD-10-CM | POA: Diagnosis not present

## 2020-07-10 DIAGNOSIS — E089 Diabetes mellitus due to underlying condition without complications: Secondary | ICD-10-CM | POA: Diagnosis not present

## 2020-07-10 DIAGNOSIS — E119 Type 2 diabetes mellitus without complications: Secondary | ICD-10-CM | POA: Diagnosis not present

## 2020-07-11 DIAGNOSIS — L728 Other follicular cysts of the skin and subcutaneous tissue: Secondary | ICD-10-CM | POA: Diagnosis not present

## 2020-07-11 DIAGNOSIS — R208 Other disturbances of skin sensation: Secondary | ICD-10-CM | POA: Diagnosis not present

## 2020-07-11 DIAGNOSIS — L720 Epidermal cyst: Secondary | ICD-10-CM | POA: Diagnosis not present

## 2020-07-17 DIAGNOSIS — M7501 Adhesive capsulitis of right shoulder: Secondary | ICD-10-CM | POA: Diagnosis not present

## 2020-08-13 DIAGNOSIS — M25611 Stiffness of right shoulder, not elsewhere classified: Secondary | ICD-10-CM | POA: Diagnosis not present

## 2020-08-13 DIAGNOSIS — M25511 Pain in right shoulder: Secondary | ICD-10-CM | POA: Diagnosis not present

## 2020-08-27 DIAGNOSIS — M25611 Stiffness of right shoulder, not elsewhere classified: Secondary | ICD-10-CM | POA: Diagnosis not present

## 2020-08-27 DIAGNOSIS — M25511 Pain in right shoulder: Secondary | ICD-10-CM | POA: Diagnosis not present

## 2020-09-03 DIAGNOSIS — M25611 Stiffness of right shoulder, not elsewhere classified: Secondary | ICD-10-CM | POA: Diagnosis not present

## 2020-09-03 DIAGNOSIS — M25511 Pain in right shoulder: Secondary | ICD-10-CM | POA: Diagnosis not present

## 2020-09-10 DIAGNOSIS — M25511 Pain in right shoulder: Secondary | ICD-10-CM | POA: Diagnosis not present

## 2020-09-10 DIAGNOSIS — M25611 Stiffness of right shoulder, not elsewhere classified: Secondary | ICD-10-CM | POA: Diagnosis not present

## 2020-09-12 DIAGNOSIS — R7989 Other specified abnormal findings of blood chemistry: Secondary | ICD-10-CM | POA: Diagnosis not present

## 2020-09-13 LAB — HEPATIC FUNCTION PANEL
ALT: 34 IU/L — ABNORMAL HIGH (ref 0–32)
AST: 34 IU/L (ref 0–40)
Albumin: 4.7 g/dL (ref 3.8–4.8)
Alkaline Phosphatase: 45 IU/L (ref 44–121)
Bilirubin Total: 0.2 mg/dL (ref 0.0–1.2)
Bilirubin, Direct: <0.1 mg/dL (ref 0.00–0.40)
Total Protein: 7.3 g/dL (ref 6.0–8.5)

## 2020-09-13 LAB — IRON,TIBC AND FERRITIN PANEL
Ferritin: 977 ng/mL — ABNORMAL HIGH (ref 15–150)
Iron Saturation: 34 % (ref 15–55)
Iron: 99 ug/dL (ref 27–139)
Total Iron Binding Capacity: 289 ug/dL (ref 250–450)
UIBC: 190 ug/dL (ref 118–369)

## 2020-09-17 DIAGNOSIS — M25611 Stiffness of right shoulder, not elsewhere classified: Secondary | ICD-10-CM | POA: Diagnosis not present

## 2020-09-17 DIAGNOSIS — M25511 Pain in right shoulder: Secondary | ICD-10-CM | POA: Diagnosis not present

## 2020-09-19 ENCOUNTER — Other Ambulatory Visit: Payer: Self-pay

## 2020-09-19 ENCOUNTER — Other Ambulatory Visit: Payer: Self-pay | Admitting: Physician Assistant

## 2020-09-19 ENCOUNTER — Encounter: Payer: Self-pay | Admitting: Physician Assistant

## 2020-09-19 ENCOUNTER — Ambulatory Visit (INDEPENDENT_AMBULATORY_CARE_PROVIDER_SITE_OTHER): Payer: Self-pay | Admitting: Physician Assistant

## 2020-09-19 DIAGNOSIS — R7989 Other specified abnormal findings of blood chemistry: Secondary | ICD-10-CM

## 2020-09-19 DIAGNOSIS — I10 Essential (primary) hypertension: Secondary | ICD-10-CM | POA: Diagnosis not present

## 2020-09-19 DIAGNOSIS — E1159 Type 2 diabetes mellitus with other circulatory complications: Secondary | ICD-10-CM

## 2020-09-19 DIAGNOSIS — M25511 Pain in right shoulder: Secondary | ICD-10-CM | POA: Diagnosis not present

## 2020-09-19 DIAGNOSIS — M8580 Other specified disorders of bone density and structure, unspecified site: Secondary | ICD-10-CM

## 2020-09-19 DIAGNOSIS — E559 Vitamin D deficiency, unspecified: Secondary | ICD-10-CM

## 2020-09-19 DIAGNOSIS — Z0001 Encounter for general adult medical examination with abnormal findings: Secondary | ICD-10-CM

## 2020-09-19 DIAGNOSIS — R300 Dysuria: Secondary | ICD-10-CM

## 2020-09-19 LAB — POCT GLYCOSYLATED HEMOGLOBIN (HGB A1C): Hemoglobin A1C: 6 % — AB (ref 4.0–5.6)

## 2020-09-19 MED ORDER — ERGOCALCIFEROL 1.25 MG (50000 UT) PO CAPS: 50000.0000 [IU] | ORAL_CAPSULE | ORAL | 2 refills | Status: DC

## 2020-09-19 MED ORDER — METFORMIN HCL 500 MG PO TABS: 500.0000 mg | ORAL_TABLET | Freq: Every day | ORAL | 3 refills | Status: DC

## 2020-09-19 NOTE — Telephone Encounter (Signed)
Please see

## 2020-09-19 NOTE — Progress Notes (Signed)
[facility] [address] [place], [place] [address]  Internal MEDICINE  Office Visit Note  Patient Name: Adrienne Brown  [id number]  [id number]  Date of Service: 09/19/2020  Chief Complaint  Patient presents with   Follow-up    Med refills   Diabetes   Hypertension    HPI Pt is here for routine follow up -Pt has been in PT for her right shoulder for about 1 month, ortho may be trying to get an MRI if not improved -BP at home has been well controlled. -BG at home not checked bc she does not have a glucometer, but has always been well controlled -Had gallbladder surgery years ago and was told she had a somewhat fatty liver back then, was managing with diet and exercise until a cruise 2 years ago and then retired-- now she eats whatever she wants and knows she needs to go back to watching what she is eating. -Discussed improving LFTs, however ferritin found to be very high and pt denies any iron supplementation. Will refer to hematology for further evaluation  Current Medication: Outpatient Encounter Medications as of 09/19/2020  Medication Sig   glucose blood (PRECISION QID TEST) test strip Check sugar once daily   Lancets MISC Check sugar once daily   metFORMIN (GLUCOPHAGE) 500 MG tablet Take 1 tablet (500 mg total) by mouth daily with breakfast.   [DISCONTINUED] ergocalciferol (VITAMIN D2) 1.25 MG (50000 UT) capsule Take 1 capsule (50,000 Units total) by mouth once a week.   [DISCONTINUED] lisinopril (ZESTRIL) 10 MG tablet Take 1 tablet (10 mg total) by mouth daily. One tablet by mouth daily   [DISCONTINUED] metFORMIN (GLUCOPHAGE) 1000 MG tablet Take 1 tablet (1,000 mg total) by mouth daily with breakfast. Take one tablet daily with meal   ergocalciferol (VITAMIN D2) 1.25 MG (50000 UT) capsule Take 1 capsule (50,000 Units total) by mouth once a week.   meloxicam (MOBIC) 15 MG tablet TAKE 1 TABLET (15 MG TOTAL) BY MOUTH DAILY. (Patient not taking: No sig reported)    nitrofurantoin, macrocrystal-monohydrate, (MACROBID) 100 MG capsule Take 1 cap twice per day for 10 days. (Patient not taking: Reported on 09/19/2020)   No facility-administered encounter medications on file as of 09/19/2020.    Surgical History: Past Surgical History:  Procedure Laterality Date   GALLBLADDER SURGERY  2013    Medical History: Past Medical History:  Diagnosis Date   Hypertension    Type II diabetes mellitus (HCC)     Family History: Family History  Problem Relation Age of Onset   Hyperthyroidism Mother        48 yrs old, smokes cigs and alcohol   Hypertension Mother    Diabetes Mother    Diabetes Father    Intestinal malrotation Father        intestinal blockage   Dementia Father     Social History   Socioeconomic History   Marital status: Unknown    Spouse name: Not on file   Number of children: Not on file   Years of education: Not on file   Highest education level: Not on file  Occupational History   Not on file  Tobacco Use   Smoking status: Former    Pack years: 0.00    Types: Cigarettes    Quit date: 03/23/2004    Years since quitting: 16.5   Smokeless tobacco: Former   Tobacco comments:    does nicorett at times, quit 2006  Substance and Sexual Activity  Alcohol use: Yes    Alcohol/week: 2.0 standard drinks    Types: 2 Glasses of wine per week    Comment: occ   Drug use: Never   Sexual activity: Not on file  Other Topics Concern   Not on file  Social History Narrative   Not on file   Social Determinants of Health   Financial Resource Strain: Not on file  Food Insecurity: Not on file  Transportation Needs: Not on file  Physical Activity: Not on file  Stress: Not on file  Social Connections: Not on file  Intimate Partner Violence: Not on file      Review of Systems  Constitutional:  Negative for chills, fatigue and unexpected weight change.  HENT:  Negative for congestion, postnasal drip, rhinorrhea, sneezing and sore  throat.   Eyes:  Negative for redness.  Respiratory:  Negative for cough, chest tightness and shortness of breath.   Cardiovascular:  Negative for chest pain and palpitations.  Gastrointestinal:  Negative for abdominal pain, constipation, diarrhea, nausea and vomiting.  Genitourinary:  Negative for dysuria and frequency.  Musculoskeletal:  Positive for arthralgias. Negative for back pain, joint swelling and neck pain.        Shoulder pain with limited ROM  Skin:  Negative for rash.  Neurological: Negative.  Negative for tremors and numbness.  Hematological:  Negative for adenopathy. Does not bruise/bleed easily.  Psychiatric/Behavioral:  Negative for behavioral problems (Depression), sleep disturbance and suicidal ideas. The patient is not nervous/anxious.    Vital Signs: BP 140/78   Pulse 99   Temp 98.7 F (37.1 C)   Resp 16   Ht 5\' 4"  (1.626 m)   Wt 144 lb 9.6 oz (65.6 kg)   SpO2 96%   BMI 24.82 kg/m    Physical Exam Vitals and nursing note reviewed.  Constitutional:      General: She is not in acute distress.    Appearance: She is well-developed and normal weight. She is not diaphoretic.  HENT:     Head: Normocephalic and atraumatic.     Mouth/Throat:     Pharynx: No oropharyngeal exudate.  Eyes:     Pupils: Pupils are equal, round, and reactive to light.  Neck:     Thyroid: No thyromegaly.     Vascular: No JVD.     Trachea: No tracheal deviation.  Cardiovascular:     Rate and Rhythm: Normal rate and regular rhythm.     Heart sounds: Normal heart sounds. No murmur heard.   No friction rub. No gallop.  Pulmonary:     Effort: Pulmonary effort is normal. No respiratory distress.     Breath sounds: No wheezing or rales.  Chest:     Chest wall: No tenderness.  Abdominal:     General: Bowel sounds are normal.     Palpations: Abdomen is soft.     Tenderness: There is no abdominal tenderness.  Musculoskeletal:     Cervical back: Normal range of motion and neck  supple.     Comments: Decreased ROM of R shoulder  Lymphadenopathy:     Cervical: No cervical adenopathy.  Skin:    General: Skin is warm and dry.  Neurological:     Mental Status: She is alert and oriented to person, place, and time.     Cranial Nerves: No cranial nerve deficit.  Psychiatric:        Behavior: Behavior normal.        Thought Content: Thought content normal.  Judgment: Judgment normal.       Assessment/Plan: 1. Controlled type 2 diabetes mellitus with other circulatory complication, without long-term current use of insulin (HCC) - POCT HgB A1C is 6.0 today, will trial decreasing to 500mg  Metformin while improving diet and exercise - metFORMIN (GLUCOPHAGE) 500 MG tablet; Take 1 tablet (500 mg total) by mouth daily with breakfast.  Dispense: 180 tablet; Refill: 3  2. Essential hypertension Continue lisinopril and monitor at home  3. Elevated ferritin Veryr elevated reading without any iron supplementation--refer to hematology - Ambulatory referral to Hematology / Oncology  4. Acute pain of right shoulder Followed by ortho and doing PT currently  5. Vitamin D deficiency - ergocalciferol (VITAMIN D2) 1.25 MG (50000 UT) capsule; Take 1 capsule (50,000 Units total) by mouth once a week.  Dispense: 4 capsule; Refill: 2   General Counseling: Jacklin verbalizes understanding of the findings of todays visit and agrees with plan of treatment. I have discussed any further diagnostic evaluation that may be needed or ordered today. We also reviewed her medications today. she has been encouraged to call the office with any questions or concerns that should arise related to todays visit.    Orders Placed This Encounter  Procedures   Ambulatory referral to Hematology / Oncology   POCT HgB A1C     Meds ordered this encounter  Medications   ergocalciferol (VITAMIN D2) 1.25 MG (50000 UT) capsule    Sig: Take 1 capsule (50,000 Units total) by mouth once a week.     Dispense:  4 capsule    Refill:  2   metFORMIN (GLUCOPHAGE) 500 MG tablet    Sig: Take 1 tablet (500 mg total) by mouth daily with breakfast.    Dispense:  180 tablet    Refill:  3     This patient was seen by , PA-C in collaboration with Dr. Lynn Ito as a part of collaborative care agreement.   Total time spent:40 Minutes Time spent includes review of chart, medications, test results, and follow up plan with the patient.      Dr Beverely Risen Internal medicine

## 2020-09-26 ENCOUNTER — Telehealth: Payer: Self-pay

## 2020-09-26 NOTE — Telephone Encounter (Signed)
LMOM to move 12/20/20 appt sooner (no more than a week sooner due to A1c)

## 2020-09-30 ENCOUNTER — Telehealth: Payer: Self-pay

## 2020-09-30 NOTE — Telephone Encounter (Signed)
Hematology referral faxed to Hematology and Oncology at [facility] at [phone]. Toni Amend

## 2020-09-30 NOTE — Telephone Encounter (Signed)
Contacted patient this afternoon in regards to referral placed on 09/19/20 to Hematology. Patient advised me at time of checking out that she wanted to contact her insurance company to see who the In network providers were. I did not hear back from patient as of 09/30/20 so I followed up and she did advise me she is contacting them today and will give me a call back. Toni Amend

## 2020-10-15 DIAGNOSIS — M25511 Pain in right shoulder: Secondary | ICD-10-CM | POA: Diagnosis not present

## 2020-10-15 DIAGNOSIS — M25611 Stiffness of right shoulder, not elsewhere classified: Secondary | ICD-10-CM | POA: Diagnosis not present

## 2020-11-16 ENCOUNTER — Other Ambulatory Visit: Payer: Self-pay | Admitting: Physician Assistant

## 2020-11-16 DIAGNOSIS — Z0001 Encounter for general adult medical examination with abnormal findings: Secondary | ICD-10-CM

## 2020-11-16 DIAGNOSIS — E559 Vitamin D deficiency, unspecified: Secondary | ICD-10-CM

## 2020-11-16 DIAGNOSIS — R7989 Other specified abnormal findings of blood chemistry: Secondary | ICD-10-CM

## 2020-11-16 DIAGNOSIS — I10 Essential (primary) hypertension: Secondary | ICD-10-CM

## 2020-11-16 DIAGNOSIS — M25511 Pain in right shoulder: Secondary | ICD-10-CM

## 2020-11-16 DIAGNOSIS — M8580 Other specified disorders of bone density and structure, unspecified site: Secondary | ICD-10-CM

## 2020-11-16 DIAGNOSIS — E1159 Type 2 diabetes mellitus with other circulatory complications: Secondary | ICD-10-CM

## 2020-11-16 DIAGNOSIS — R300 Dysuria: Secondary | ICD-10-CM

## 2020-12-19 ENCOUNTER — Telehealth: Payer: Self-pay

## 2020-12-19 ENCOUNTER — Other Ambulatory Visit: Payer: Self-pay | Admitting: Physician Assistant

## 2020-12-19 DIAGNOSIS — I10 Essential (primary) hypertension: Secondary | ICD-10-CM

## 2020-12-19 DIAGNOSIS — E1159 Type 2 diabetes mellitus with other circulatory complications: Secondary | ICD-10-CM

## 2020-12-19 DIAGNOSIS — R7989 Other specified abnormal findings of blood chemistry: Secondary | ICD-10-CM

## 2020-12-19 DIAGNOSIS — M8580 Other specified disorders of bone density and structure, unspecified site: Secondary | ICD-10-CM

## 2020-12-19 DIAGNOSIS — Z0001 Encounter for general adult medical examination with abnormal findings: Secondary | ICD-10-CM

## 2020-12-19 DIAGNOSIS — M25511 Pain in right shoulder: Secondary | ICD-10-CM

## 2020-12-19 DIAGNOSIS — R300 Dysuria: Secondary | ICD-10-CM

## 2020-12-19 DIAGNOSIS — E559 Vitamin D deficiency, unspecified: Secondary | ICD-10-CM

## 2020-12-19 NOTE — Telephone Encounter (Signed)
Pt lmom for med refill for lisinopril left her message that we send pres to phar keep follow up appt coming up in nov

## 2020-12-20 ENCOUNTER — Ambulatory Visit: Payer: Medicare HMO | Admitting: Nurse Practitioner

## 2021-01-16 DIAGNOSIS — Z7984 Long term (current) use of oral hypoglycemic drugs: Secondary | ICD-10-CM | POA: Diagnosis not present

## 2021-01-16 DIAGNOSIS — K760 Fatty (change of) liver, not elsewhere classified: Secondary | ICD-10-CM | POA: Diagnosis not present

## 2021-01-16 DIAGNOSIS — R7989 Other specified abnormal findings of blood chemistry: Secondary | ICD-10-CM | POA: Diagnosis not present

## 2021-01-16 DIAGNOSIS — E119 Type 2 diabetes mellitus without complications: Secondary | ICD-10-CM | POA: Diagnosis not present

## 2021-01-22 ENCOUNTER — Ambulatory Visit (INDEPENDENT_AMBULATORY_CARE_PROVIDER_SITE_OTHER): Payer: Medicare HMO | Admitting: Nurse Practitioner

## 2021-01-22 ENCOUNTER — Encounter: Payer: Self-pay | Admitting: Nurse Practitioner

## 2021-01-22 ENCOUNTER — Other Ambulatory Visit: Payer: Self-pay

## 2021-01-22 VITALS — BP 150/75 | HR 100 | Temp 98.5°F | Resp 16 | Ht 64.0 in | Wt 146.4 lb

## 2021-01-22 DIAGNOSIS — Z1211 Encounter for screening for malignant neoplasm of colon: Secondary | ICD-10-CM | POA: Diagnosis not present

## 2021-01-22 DIAGNOSIS — E1159 Type 2 diabetes mellitus with other circulatory complications: Secondary | ICD-10-CM | POA: Diagnosis not present

## 2021-01-22 DIAGNOSIS — E559 Vitamin D deficiency, unspecified: Secondary | ICD-10-CM

## 2021-01-22 DIAGNOSIS — I10 Essential (primary) hypertension: Secondary | ICD-10-CM | POA: Diagnosis not present

## 2021-01-22 DIAGNOSIS — M8580 Other specified disorders of bone density and structure, unspecified site: Secondary | ICD-10-CM

## 2021-01-22 DIAGNOSIS — R300 Dysuria: Secondary | ICD-10-CM

## 2021-01-22 DIAGNOSIS — Z0001 Encounter for general adult medical examination with abnormal findings: Secondary | ICD-10-CM

## 2021-01-22 DIAGNOSIS — M25511 Pain in right shoulder: Secondary | ICD-10-CM

## 2021-01-22 DIAGNOSIS — R7989 Other specified abnormal findings of blood chemistry: Secondary | ICD-10-CM

## 2021-01-22 LAB — POCT GLYCOSYLATED HEMOGLOBIN (HGB A1C): Hemoglobin A1C: 6.4 % — AB (ref 4.0–5.6)

## 2021-01-22 MED ORDER — LISINOPRIL 10 MG PO TABS: 10.0000 mg | ORAL_TABLET | Freq: Every day | ORAL | 1 refills | Status: DC

## 2021-01-22 NOTE — Progress Notes (Signed)
[facility] [address] [place], [place] [address]  Internal MEDICINE  Office Visit Note  Patient Name: Adrienne Brown  [id number]  [id number]  Date of Service: 01/22/2021  Chief Complaint  Patient presents with   Follow-up   Hypertension   Diabetes   Quality Metric Gaps    Getting covid booster and flu shot together over the weekend, needs colonoscopy, has been menopausal since her 40's has not had pap in 4 years    HPI Adrienne Brown presents for a follow up for hypertension and diabetes. She is also in need of refill of lisinopril. Her A1C is 6.4 today which is up from 6.0 in July. She is already taking metformin and would like to hold off on any medication adjustments for now. She is 65 years old and does not want a pap as she will age out after this year, she acknowledges that she can have a pelvic exam done as needed if she is having any issues such as pelvic pain, postmenopausal bleeding or any other changes, signs or symptoms in the future. She has an open referral to GI for routine colonoscopy and affirms that she will call to get that scheduled. She has also had her mammogram done this year.  Her blood pressure is elevated today. Her blood pressure has been better at home. SBP ranges from 117 to 125 and DBP ranges from 60-70.     Current Medication: Outpatient Encounter Medications as of 01/22/2021  Medication Sig   ergocalciferol (VITAMIN D2) 1.25 MG (50000 UT) capsule Take 1 capsule (50,000 Units total) by mouth once a week.   glucose blood (PRECISION QID TEST) test strip Check sugar once daily   Lancets MISC Check sugar once daily   metFORMIN (GLUCOPHAGE) 500 MG tablet Take 1 tablet (500 mg total) by mouth daily with breakfast.   [DISCONTINUED] lisinopril (ZESTRIL) 10 MG tablet TAKE 1 TABLET BY MOUTH EVERY DAY   lisinopril (ZESTRIL) 10 MG tablet Take 1 tablet (10 mg total) by mouth daily.   [DISCONTINUED] meloxicam (MOBIC) 15 MG tablet TAKE 1 TABLET (15 MG  TOTAL) BY MOUTH DAILY. (Patient not taking: No sig reported)   [DISCONTINUED] nitrofurantoin, macrocrystal-monohydrate, (MACROBID) 100 MG capsule Take 1 cap twice per day for 10 days. (Patient not taking: Reported on 09/19/2020)   No facility-administered encounter medications on file as of 01/22/2021.    Surgical History: Past Surgical History:  Procedure Laterality Date   GALLBLADDER SURGERY  2013    Medical History: Past Medical History:  Diagnosis Date   Hypertension    Type II diabetes mellitus (HCC)     Family History: Family History  Problem Relation Age of Onset   Hyperthyroidism Mother        41 yrs old, smokes cigs and alcohol   Hypertension Mother    Diabetes Mother    Diabetes Father    Intestinal malrotation Father        intestinal blockage   Dementia Father     Social History   Socioeconomic History   Marital status: Unknown    Spouse name: Not on file   Number of children: Not on file   Years of education: Not on file   Highest education level: Not on file  Occupational History   Not on file  Tobacco Use   Smoking status: Former    Types: Cigarettes    Quit date: 03/23/2004    Years since quitting: 16.9   Smokeless tobacco: Former   Tobacco  comments:    does nicorett at times, quit 2006  Substance and Sexual Activity   Alcohol use: Yes    Alcohol/week: 2.0 standard drinks    Types: 2 Glasses of wine per week    Comment: occ   Drug use: Never   Sexual activity: Not on file  Other Topics Concern   Not on file  Social History Narrative   Not on file   Social Determinants of Health   Financial Resource Strain: Not on file  Food Insecurity: Not on file  Transportation Needs: Not on file  Physical Activity: Not on file  Stress: Not on file  Social Connections: Not on file  Intimate Partner Violence: Not on file      Review of Systems  Constitutional:  Negative for chills, fatigue and unexpected weight change.  HENT:  Negative for  congestion, rhinorrhea, sneezing and sore throat.   Eyes:  Negative for redness.  Respiratory:  Negative for cough, chest tightness and shortness of breath.   Cardiovascular:  Negative for chest pain and palpitations.  Gastrointestinal:  Negative for abdominal pain, constipation, diarrhea, nausea and vomiting.  Genitourinary:  Negative for dysuria and frequency.  Musculoskeletal:  Negative for arthralgias, back pain, joint swelling and neck pain.  Skin:  Negative for rash.  Neurological: Negative.  Negative for tremors and numbness.  Hematological:  Negative for adenopathy. Does not bruise/bleed easily.  Psychiatric/Behavioral:  Negative for behavioral problems (Depression), sleep disturbance and suicidal ideas. The patient is not nervous/anxious.    Vital Signs: BP (!) 150/75 Comment: 150/80  Pulse 100   Temp 98.5 F (36.9 C)   Resp 16   Ht 5\' 4"  (1.626 m)   Wt 146 lb 6.4 oz (66.4 kg)   SpO2 98%   BMI 25.13 kg/m    Physical Exam Vitals reviewed.  Constitutional:      General: She is not in acute distress.    Appearance: Normal appearance. She is normal weight. She is not ill-appearing.  HENT:     Head: Normocephalic and atraumatic.  Eyes:     Pupils: Pupils are equal, round, and reactive to light.  Cardiovascular:     Rate and Rhythm: Normal rate and regular rhythm.  Neurological:     Mental Status: She is alert and oriented to person, place, and time.     Cranial Nerves: No cranial nerve deficit.     Coordination: Coordination normal.     Gait: Gait normal.  Psychiatric:        Mood and Affect: Mood normal.        Behavior: Behavior normal.       Assessment/Plan: 1. Controlled type 2 diabetes mellitus with other circulatory complication, without long-term current use of insulin (HCC) A1C is elevated at 6.4. patient is focusing on diet and lifestyle modifications for now. She admits that she has been consuming more high-carb foods than she should be. She also is  aware that she needs to be more physically active and She does not want to change her medications at this time. Repeat A1c in 3 months, if no improvement, will discuss increasing metformin to 1000 mg twice daily.  - POCT HgB A1C  2. Essential hypertension Blood pressure elevated in office but stable at home. Lisinopril refills ordered, patient declined medication adjustment since her blood pressures are wn - lisinopril (ZESTRIL) 10 MG tablet; Take 1 tablet (10 mg total) by mouth daily.  Dispense: 30 tablet; Refill: 1  3. Screening for colon cancer  Prior order for GI referral for routine colonoscopy, the patient is planning on calling their office to schedule the colonoscopy.    General Counseling: Shemicka verbalizes understanding of the findings of todays visit and agrees with plan of treatment. I have discussed any further diagnostic evaluation that may be needed or ordered today. We also reviewed her medications today. she has been encouraged to call the office with any questions or concerns that should arise related to todays visit.    Orders Placed This Encounter  Procedures   POCT HgB A1C    Meds ordered this encounter  Medications   lisinopril (ZESTRIL) 10 MG tablet    Sig: Take 1 tablet (10 mg total) by mouth daily.    Dispense:  30 tablet    Refill:  1    Return in about 3 months (around 04/24/2021) for F/U, Recheck A1C, Earl Losee PCP.   Total time spent:30 Minutes Time spent includes review of chart, medications, test results, and follow up plan with the patient.   [place] Controlled Substance Database was reviewed by me.  This patient was seen by Sallyanne Kuster, FNP-C in collaboration with Dr. Beverely Risen as a part of collaborative care agreement.   Veer Elamin R. Tedd Sias, MSN, FNP-C Internal medicine

## 2021-02-14 ENCOUNTER — Encounter: Payer: Self-pay | Admitting: Nurse Practitioner

## 2021-04-15 ENCOUNTER — Telehealth: Payer: Self-pay

## 2021-04-15 NOTE — Telephone Encounter (Signed)
PATIENT WILL CALL us BACK WHEN SHE IS READY TO SCHEDULE

## 2021-04-22 ENCOUNTER — Ambulatory Visit: Payer: Medicare HMO | Admitting: Nurse Practitioner

## 2021-04-27 IMAGING — MG MM DIGITAL SCREENING BILAT W/ TOMO AND CAD
8 series · 8 of 24 positions shown · non-contrast
Comparison: Previous exam(s).

CLINICAL DATA: Screening.

EXAM:
DIGITAL SCREENING BILATERAL MAMMOGRAM WITH TOMOSYNTHESIS AND CAD
TECHNIQUE: Bilateral screening digital craniocaudal and mediolateral oblique
mammograms were obtained. Bilateral screening digital breast
tomosynthesis was performed. The images were evaluated with
computer-aided detection.

[L CC synth-2D]
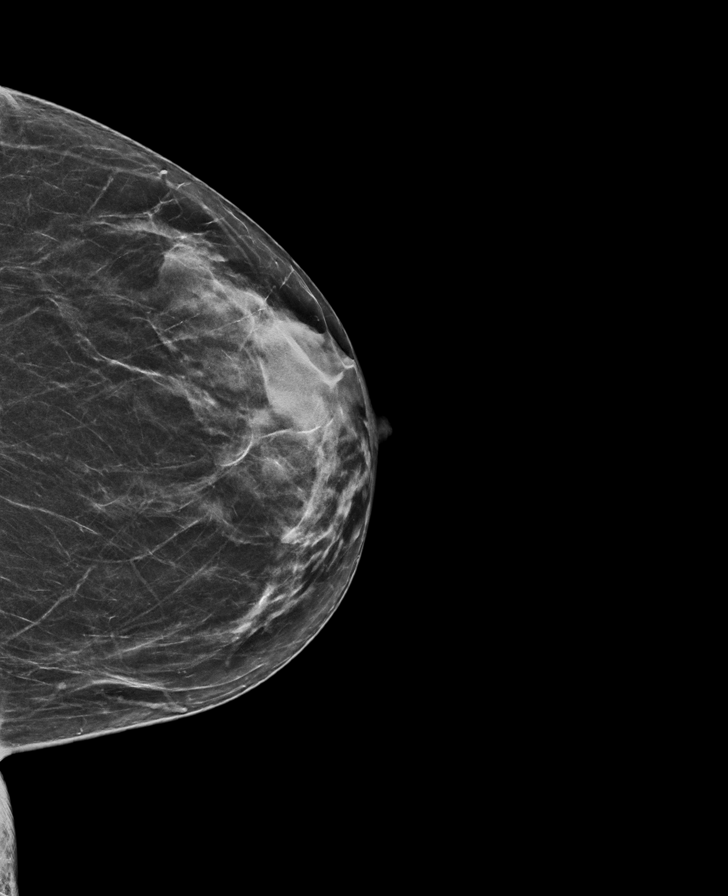

[R CC synth-2D]
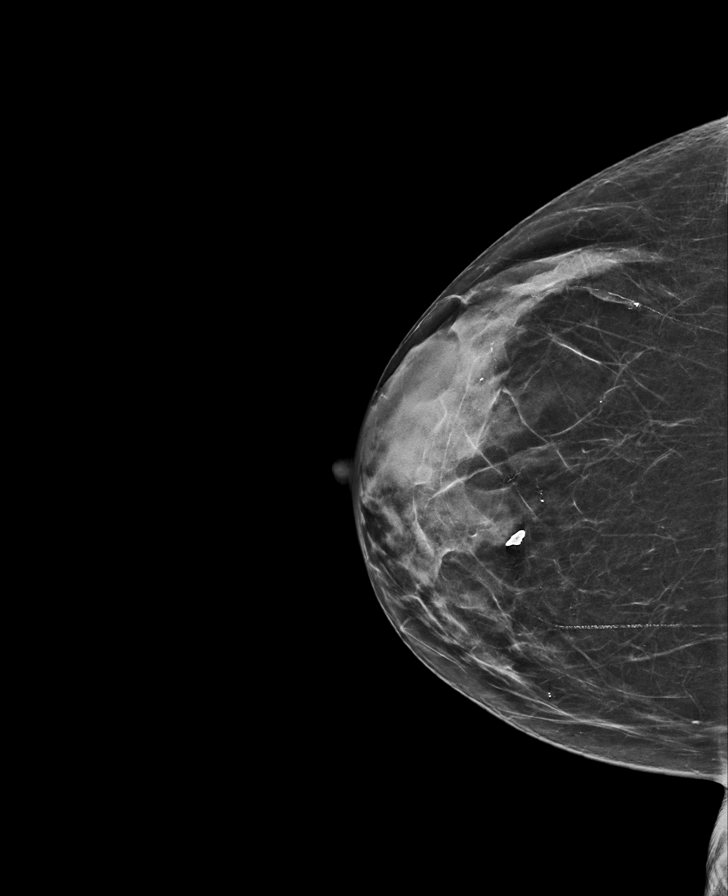

[R MLO synth-2D]
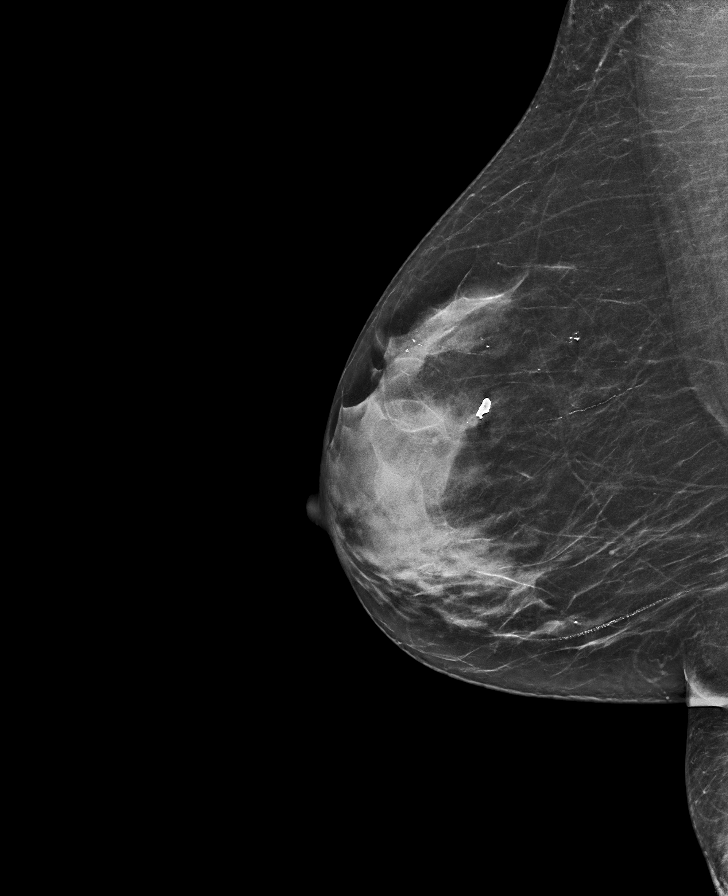

[L MLO synth-2D]
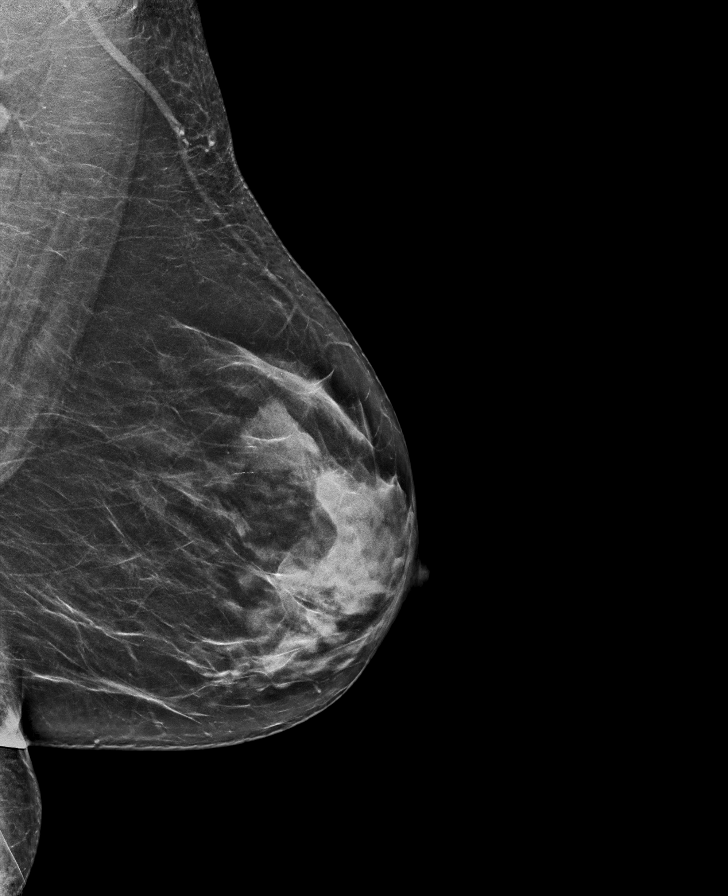

[R CC tomo · tomo slice 29/56.0]
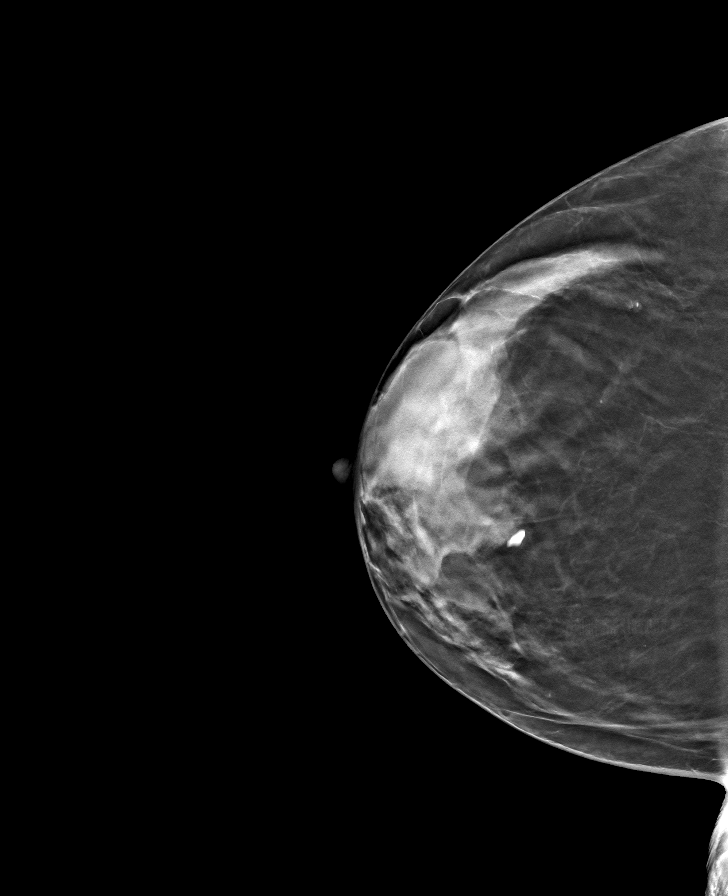

[L MLO tomo · tomo slice 32/63.0]
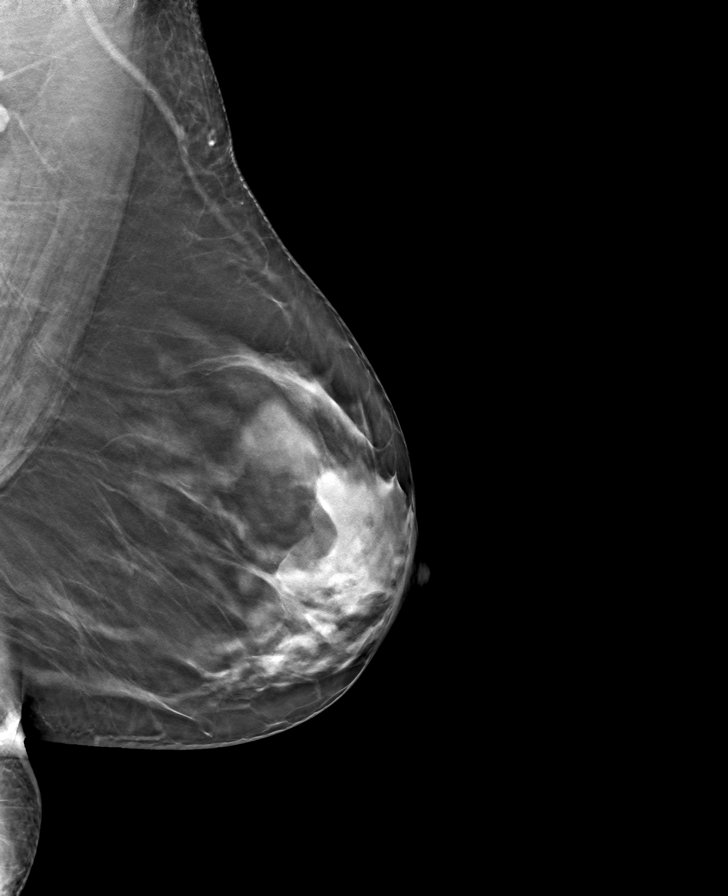

[R MLO tomo · tomo slice 31/60.0]
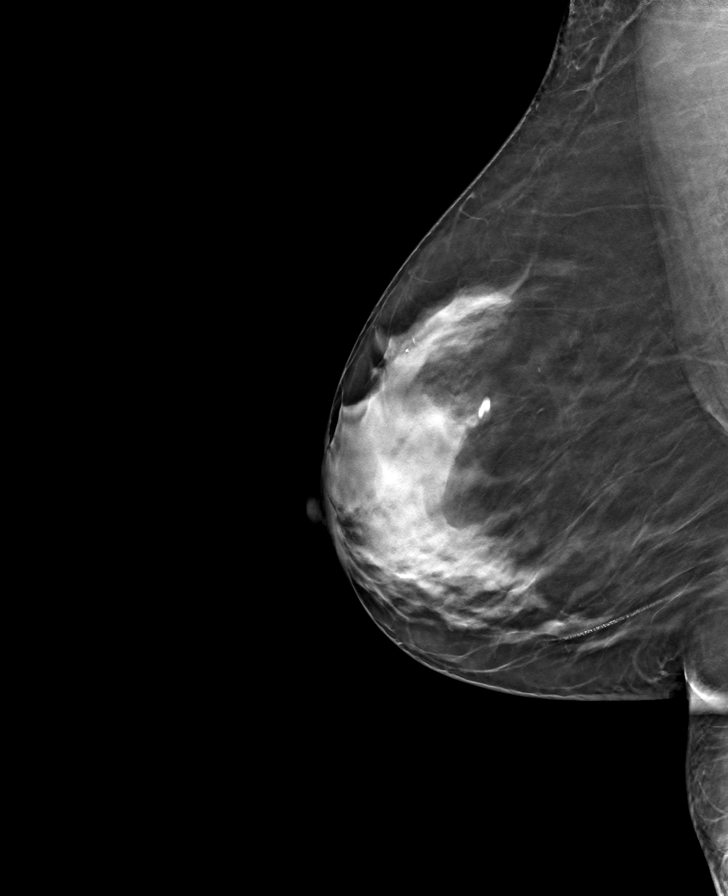

[L CC tomo · tomo slice 28/55.0]
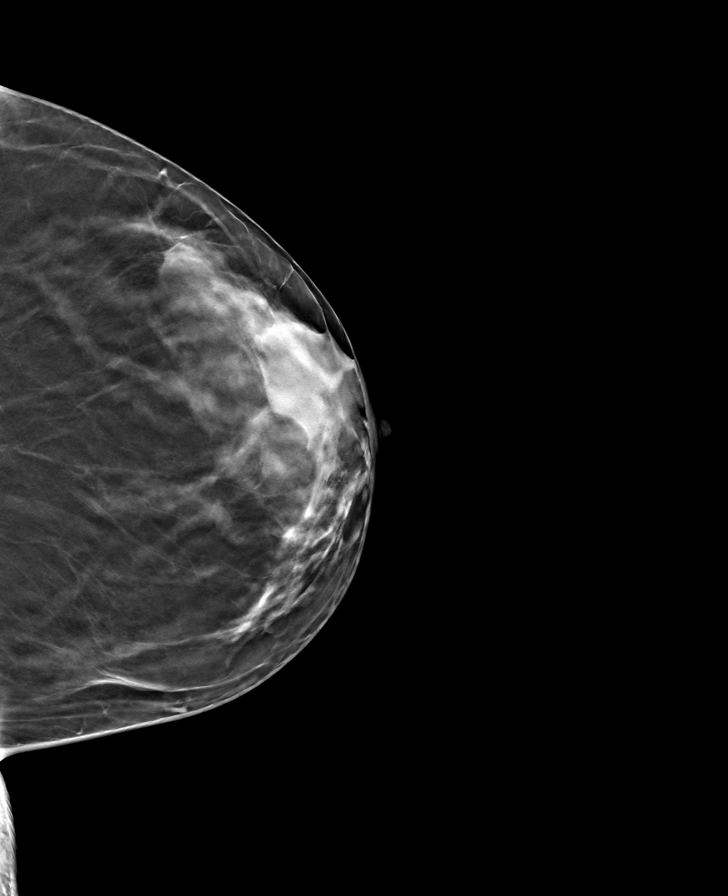

[8 of 24 positions shown; findings below may reference images not displayed]

ACR Breast Density Category c: The breast tissue is heterogeneously
dense, which may obscure small masses.
FINDINGS: There are no findings suspicious for malignancy. The images were
evaluated with computer-aided detection.
IMPRESSION: No mammographic evidence of malignancy. A result letter of this
screening mammogram will be mailed directly to the patient.

RECOMMENDATION:
Screening mammogram in one year. (Code:T4-5-GWO)

BI-RADS CATEGORY  1: Negative.

## 2021-04-28 ENCOUNTER — Encounter: Payer: Self-pay | Admitting: Nurse Practitioner

## 2021-04-28 ENCOUNTER — Other Ambulatory Visit: Payer: Self-pay

## 2021-04-28 ENCOUNTER — Ambulatory Visit (INDEPENDENT_AMBULATORY_CARE_PROVIDER_SITE_OTHER): Payer: Medicare HMO | Admitting: Nurse Practitioner

## 2021-04-28 VITALS — BP 140/80 | HR 103 | Temp 99.0°F | Resp 16 | Ht 64.5 in | Wt 147.6 lb

## 2021-04-28 DIAGNOSIS — I10 Essential (primary) hypertension: Secondary | ICD-10-CM | POA: Diagnosis not present

## 2021-04-28 DIAGNOSIS — E559 Vitamin D deficiency, unspecified: Secondary | ICD-10-CM

## 2021-04-28 DIAGNOSIS — E1159 Type 2 diabetes mellitus with other circulatory complications: Secondary | ICD-10-CM

## 2021-04-28 LAB — POCT GLYCOSYLATED HEMOGLOBIN (HGB A1C): Hemoglobin A1C: 6.3 % — AB (ref 4.0–5.6)

## 2021-04-28 MED ORDER — LISINOPRIL 10 MG PO TABS: 10.0000 mg | ORAL_TABLET | Freq: Every day | ORAL | 1 refills | Status: DC

## 2021-04-28 NOTE — Progress Notes (Signed)
[facility] [address] [place], [place] [address]  Internal MEDICINE  Office Visit Note  Patient Name: Adrienne Brown  [id number]  [id number]  Date of Service: 04/28/2021  Chief Complaint  Patient presents with   Follow-up    Discuss meds   Diabetes   Hypertension   Medication Refill    HPI Adrienne Brown presents for a follow up visit for diabetes and medication review. Her A1C improved slightly by 0.1 at 6.3 today, and was 6.4 in November 2022. Her last vitamin D level was 10.2 in march 2022. She took vitamin D 50,000 units weekly for a few months in early 2022 but the vitamin D level was never rechecked. Her bone density scan showed some osteopenia.    Her blood pressure is stable.     Current Medication: Outpatient Encounter Medications as of 04/28/2021  Medication Sig   ergocalciferol (VITAMIN D2) 1.25 MG (50000 UT) capsule Take 1 capsule (50,000 Units total) by mouth once a week.   glucose blood (PRECISION QID TEST) test strip Check sugar once daily   Lancets MISC Check sugar once daily   lisinopril (ZESTRIL) 10 MG tablet Take 1 tablet (10 mg total) by mouth daily.   metFORMIN (GLUCOPHAGE) 500 MG tablet Take 1 tablet (500 mg total) by mouth daily with breakfast.   No facility-administered encounter medications on file as of 04/28/2021.    Surgical History: Past Surgical History:  Procedure Laterality Date   GALLBLADDER SURGERY  2013    Medical History: Past Medical History:  Diagnosis Date   Hypertension    Type II diabetes mellitus (HCC)     Family History: Family History  Problem Relation Age of Onset   Hyperthyroidism Mother        64 yrs old, smokes cigs and alcohol   Hypertension Mother    Diabetes Mother    Diabetes Father    Intestinal malrotation Father        intestinal blockage   Dementia Father     Social History   Socioeconomic History   Marital status: Unknown    Spouse name: Not on file   Number of children: Not on file    Years of education: Not on file   Highest education level: Not on file  Occupational History   Not on file  Tobacco Use   Smoking status: Former    Types: Cigarettes    Quit date: 03/23/2004    Years since quitting: 17.1   Smokeless tobacco: Former   Tobacco comments:    does nicorett at times, quit 2006  Substance and Sexual Activity   Alcohol use: Yes    Alcohol/week: 2.0 standard drinks    Types: 2 Glasses of wine per week    Comment: occ   Drug use: Never   Sexual activity: Not on file  Other Topics Concern   Not on file  Social History Narrative   Not on file   Social Determinants of Health   Financial Resource Strain: Not on file  Food Insecurity: Not on file  Transportation Needs: Not on file  Physical Activity: Not on file  Stress: Not on file  Social Connections: Not on file  Intimate Partner Violence: Not on file      Review of Systems  Constitutional:  Negative for chills, fatigue and unexpected weight change.  HENT:  Negative for congestion, rhinorrhea, sneezing and sore throat.   Eyes:  Negative for redness.  Respiratory:  Negative for cough, chest tightness and  shortness of breath.   Cardiovascular:  Negative for chest pain and palpitations.  Gastrointestinal:  Negative for abdominal pain, constipation, diarrhea, nausea and vomiting.  Genitourinary:  Negative for dysuria and frequency.  Musculoskeletal:  Negative for arthralgias, back pain, joint swelling and neck pain.  Skin:  Negative for rash.  Neurological: Negative.  Negative for tremors and numbness.  Hematological:  Negative for adenopathy. Does not bruise/bleed easily.  Psychiatric/Behavioral:  Negative for behavioral problems (Depression), sleep disturbance and suicidal ideas. The patient is not nervous/anxious.    Vital Signs: BP 140/80    Pulse (!) 103    Temp 99 F (37.2 C)    Resp 16    Ht 5' 4.5" (1.638 m)    Wt 147 lb 9.6 oz (67 kg)    SpO2 99%    BMI 24.94 kg/m    Physical  Exam Vitals reviewed.  Constitutional:      General: She is not in acute distress.    Appearance: Normal appearance. She is normal weight. She is not ill-appearing.  HENT:     Head: Normocephalic and atraumatic.  Eyes:     Pupils: Pupils are equal, round, and reactive to light.  Cardiovascular:     Rate and Rhythm: Normal rate and regular rhythm.  Pulmonary:     Effort: Pulmonary effort is normal. No respiratory distress.  Skin:    Findings: Rash present.  Neurological:     Mental Status: She is alert and oriented to person, place, and time.     Cranial Nerves: No cranial nerve deficit.     Coordination: Coordination normal.     Gait: Gait normal.  Psychiatric:        Mood and Affect: Mood normal.        Behavior: Behavior normal.       Assessment/Plan: 1. Controlled type 2 diabetes mellitus with other circulatory complication, without long-term current use of insulin (HCC) Slight improvement in A1C, continue current medication. Repeat A1C in 3 months.  - POCT HgB A1C  2. Essential hypertension Stable, refills ordered.  - lisinopril (ZESTRIL) 10 MG tablet; Take 1 tablet (10 mg total) by mouth daily.  Dispense: 90 tablet; Refill: 1  3. Vitamin D deficiency Repeat vitamin D level to determine how much supplement the patient should take  - Vitamin D (25 hydroxy)   General Counseling: Adrienne Brown verbalizes understanding of the findings of todays visit and agrees with plan of treatment. I have discussed any further diagnostic evaluation that may be needed or ordered today. We also reviewed her medications today. she has been encouraged to call the office with any questions or concerns that should arise related to todays visit.    Orders Placed This Encounter  Procedures   Vitamin D (25 hydroxy)   POCT HgB A1C    No orders of the defined types were placed in this encounter.   Return in about 3 months (around 07/26/2021) for F/U, Recheck A1C, Adrienne Brown PCP.   Total time  spent:30 Minutes Time spent includes review of chart, medications, test results, and follow up plan with the patient.   [place] Controlled Substance Database was reviewed by me.  This patient was seen by Sallyanne Kuster, FNP-C in collaboration with Dr. Beverely Risen as a part of collaborative care agreement.   Herley Bernardini R. Tedd Sias, MSN, FNP-C Internal medicine

## 2021-06-26 ENCOUNTER — Ambulatory Visit: Payer: Medicare HMO | Admitting: Nurse Practitioner

## 2021-07-28 ENCOUNTER — Encounter: Payer: Self-pay | Admitting: Nurse Practitioner

## 2021-07-28 ENCOUNTER — Ambulatory Visit (INDEPENDENT_AMBULATORY_CARE_PROVIDER_SITE_OTHER): Payer: Medicare HMO | Admitting: Nurse Practitioner

## 2021-07-28 VITALS — BP 135/85 | HR 98 | Temp 98.5°F | Resp 16 | Ht 64.5 in | Wt 153.2 lb

## 2021-07-28 DIAGNOSIS — E782 Mixed hyperlipidemia: Secondary | ICD-10-CM

## 2021-07-28 DIAGNOSIS — R300 Dysuria: Secondary | ICD-10-CM

## 2021-07-28 DIAGNOSIS — E559 Vitamin D deficiency, unspecified: Secondary | ICD-10-CM | POA: Diagnosis not present

## 2021-07-28 DIAGNOSIS — Z1211 Encounter for screening for malignant neoplasm of colon: Secondary | ICD-10-CM

## 2021-07-28 DIAGNOSIS — E1159 Type 2 diabetes mellitus with other circulatory complications: Secondary | ICD-10-CM | POA: Diagnosis not present

## 2021-07-28 DIAGNOSIS — Z0001 Encounter for general adult medical examination with abnormal findings: Secondary | ICD-10-CM | POA: Diagnosis not present

## 2021-07-28 DIAGNOSIS — R7989 Other specified abnormal findings of blood chemistry: Secondary | ICD-10-CM | POA: Diagnosis not present

## 2021-07-28 DIAGNOSIS — E538 Deficiency of other specified B group vitamins: Secondary | ICD-10-CM | POA: Diagnosis not present

## 2021-07-28 LAB — POCT GLYCOSYLATED HEMOGLOBIN (HGB A1C): Hemoglobin A1C: 6.5 % — AB (ref 4.0–5.6)

## 2021-07-28 NOTE — Progress Notes (Signed)
[facility] [address] [place], [place] [address]  Internal MEDICINE  Office Visit Note  Patient Name: Adrienne Brown  11/29/1955  [id number]  Date of Service: 07/28/2021  Chief Complaint  Patient presents with   Diabetes   Medicare Wellness   Hypertension    HPI Adrienne Brown presents for an annual well visit and physical exam.  She is a well-appearing 65-year-old female with hypertension and type 2 diabetes.  She is due for colorectal cancer screening and would prefer to try the Cologuard stool test first.  She is due for routine labs.  Her A1c was due to be checked today and it was 6.5 which is in diabetic range and elevated when compared to previous level of 6.3 in February of this year.  Her last mammogram was March of 2022.  She also had a bone density scan last year.  She reports she did get a letter from [facility] about having her mammogram done so she will call to get that scheduled later. -Patient does admit that she is worried about things she cannot control.  At this time she does not want to try any medication or change any medication she is working on controlling this anxiety and letting things go that she is not able to control    Current Medication: Outpatient Encounter Medications as of 07/28/2021  Medication Sig   ergocalciferol (VITAMIN D2) 1.25 MG (50000 UT) capsule Take 1 capsule (50,000 Units total) by mouth once a week.   glucose blood (PRECISION QID TEST) test strip Check sugar once daily   Lancets MISC Check sugar once daily   lisinopril (ZESTRIL) 10 MG tablet Take 1 tablet (10 mg total) by mouth daily.   metFORMIN (GLUCOPHAGE) 500 MG tablet Take 1 tablet (500 mg total) by mouth daily with breakfast.   No facility-administered encounter medications on file as of 07/28/2021.    Surgical History: Past Surgical History:  Procedure Laterality Date   COLONOSCOPY WITH PROPOFOL N/A 09/12/2021   Procedure: COLONOSCOPY WITH PROPOFOL;   Surgeon: Anna, Kiran, MD;  Location: [facility] ENDOSCOPY;  Service: Gastroenterology;  Laterality: N/A;   GALLBLADDER SURGERY  2013    Medical History: Past Medical History:  Diagnosis Date   Hypertension    Type II diabetes mellitus (HCC)     Family History: Family History  Problem Relation Age of Onset   Hyperthyroidism Mother        85 yrs old, smokes cigs and alcohol   Hypertension Mother    Diabetes Mother    Diabetes Father    Intestinal malrotation Father        intestinal blockage   Dementia Father     Social History   Socioeconomic History   Marital status: Unknown    Spouse name: Not on file   Number of children: Not on file   Years of education: Not on file   Highest education level: Not on file  Occupational History   Not on file  Tobacco Use   Smoking status: Former    Types: Cigarettes    Quit date: 03/23/2004    Years since quitting: 17.5   Smokeless tobacco: Former   Tobacco comments:    does nicorett at times, quit 2006  Vaping Use   Vaping Use: Never used  Substance and Sexual Activity   Alcohol use: Yes    Alcohol/week: 2.0 standard drinks of alcohol    Types: 2 Glasses of wine per week    Comment:   occ   Drug use: Never   Sexual activity: Not on file  Other Topics Concern   Not on file  Social History Narrative   Not on file   Social Determinants of Health   Financial Resource Strain: Not on file  Food Insecurity: Not on file  Transportation Needs: Not on file  Physical Activity: Not on file  Stress: Not on file  Social Connections: Not on file  Intimate Partner Violence: Not on file      Review of Systems  Constitutional:  Negative for activity change, appetite change, chills, fatigue, fever and unexpected weight change.  HENT: Negative.  Negative for congestion, ear pain, rhinorrhea, sore throat and trouble swallowing.   Eyes: Negative.   Respiratory: Negative.  Negative for cough, chest tightness, shortness of breath and  wheezing.   Cardiovascular: Negative.  Negative for chest pain.  Gastrointestinal: Negative.  Negative for abdominal pain, blood in stool, constipation, diarrhea, nausea and vomiting.  Endocrine: Negative.   Genitourinary: Negative.  Negative for difficulty urinating, dysuria, frequency, hematuria and urgency.  Musculoskeletal: Negative.  Negative for arthralgias, back pain, joint swelling, myalgias and neck pain.  Skin: Negative.  Negative for rash and wound.  Allergic/Immunologic: Negative.  Negative for immunocompromised state.  Neurological: Negative.  Negative for dizziness, seizures, numbness and headaches.  Hematological: Negative.   Psychiatric/Behavioral: Negative.  Negative for behavioral problems, self-injury and suicidal ideas. The patient is not nervous/anxious.     Vital Signs: BP 135/85 Comment: 156/80  Pulse 98   Temp 98.5 F (36.9 C)   Resp 16   Ht 5' 4.5" (1.638 m)   Wt 153 lb 3.2 oz (69.5 kg)   SpO2 98%   BMI 25.89 kg/m    Physical Exam Vitals reviewed.  Constitutional:      General: She is awake. She is not in acute distress.    Appearance: Normal appearance. She is well-developed, well-groomed and normal weight. She is not ill-appearing or diaphoretic.  HENT:     Head: Normocephalic and atraumatic.     Right Ear: Tympanic membrane, ear canal and external ear normal.     Left Ear: Tympanic membrane, ear canal and external ear normal.     Nose: Nose normal.     Mouth/Throat:     Lips: Pink.     Mouth: Mucous membranes are moist.     Pharynx: Oropharynx is clear. Uvula midline. No oropharyngeal exudate or posterior oropharyngeal erythema.  Eyes:     General: Lids are normal. Vision grossly intact. Gaze aligned appropriately. No scleral icterus.       Right eye: No discharge.        Left eye: No discharge.     Extraocular Movements: Extraocular movements intact.     Conjunctiva/sclera: Conjunctivae normal.     Pupils: Pupils are equal, round, and  reactive to light.     Funduscopic exam:    Right eye: Red reflex present.        Left eye: Red reflex present. Neck:     Thyroid: No thyromegaly.     Vascular: No JVD.     Trachea: Trachea and phonation normal. No tracheal deviation.  Cardiovascular:     Rate and Rhythm: Normal rate and regular rhythm.     Pulses: Normal pulses.     Heart sounds: Normal heart sounds, S1 normal and S2 normal. No murmur heard.    No friction rub. No gallop.  Pulmonary:     Effort: Pulmonary effort is normal.  No accessory muscle usage or respiratory distress.     Breath sounds: Normal breath sounds. No stridor. No wheezing or rales.  Chest:     Chest wall: No tenderness.     Comments: Due for mammogram Abdominal:     General: Bowel sounds are normal. There is no distension.     Palpations: Abdomen is soft. There is no shifting dullness, fluid wave, mass or pulsatile mass.     Tenderness: There is no abdominal tenderness. There is no guarding or rebound.  Musculoskeletal:        General: No tenderness or deformity. Normal range of motion.     Cervical back: Normal range of motion and neck supple.     Right lower leg: No edema.     Left lower leg: No edema.  Lymphadenopathy:     Cervical: No cervical adenopathy.  Skin:    General: Skin is warm and dry.     Capillary Refill: Capillary refill takes less than 2 seconds.     Coloration: Skin is not pale.     Findings: No erythema or rash.  Neurological:     Mental Status: She is alert and oriented to person, place, and time.     Cranial Nerves: No cranial nerve deficit.     Motor: No abnormal muscle tone.     Coordination: Coordination normal.     Deep Tendon Reflexes: Reflexes are normal and symmetric.  Psychiatric:        Mood and Affect: Mood and affect normal.        Behavior: Behavior normal. Behavior is cooperative.        Thought Content: Thought content normal.        Judgment: Judgment normal.        Assessment/Plan: 1. Encounter  for general adult medical examination with abnormal findings Age-appropriate preventive screenings and vaccinations discussed, annual physical exam completed. Routine labs for health maintenance ordered, see below. PHM updated. Mammogram due, BMD screening done last year, cologuard test ordered.  - CBC with Differential/Platelet - CMP14+EGFR - TSH + free T4 - Vitamin D (25 hydroxy) - Lipid Profile - B12 and Folate Panel  2. Controlled type 2 diabetes mellitus with other circulatory complication, without long-term current use of insulin (HCC) A1c slightly elevated at 6.5, no changes in current medication regimen.  Continue diet and lifestyle modifications as previously discussed, other routine labs ordered, repeat A1c in 3 months - POCT glycosylated hemoglobin (Hb A1C) - CBC with Differential/Platelet  3. Elevated LFTs Routine lab ordered - CBC with Differential/Platelet - CMP14+EGFR  4. Mixed hyperlipidemia Routine lab ordered - TSH + free T4 - Lipid Profile  5. Vitamin D deficiency Routine lab ordered - Vitamin D (25 hydroxy)  6. B12 deficiency Routine labs ordered - CBC with Differential/Platelet - B12 and Folate Panel  7. Dysuria Routine urinalysis sent - UA/M w/rflx Culture, Routine - Microscopic Examination - Urine Culture, Reflex  8. Screening for colon cancer Cologuard test ordered - Cologuard     General Counseling: Adrienne Brown verbalizes understanding of the findings of todays visit and agrees with plan of treatment. I have discussed any further diagnostic evaluation that may be needed or ordered today. We also reviewed her medications today. she has been encouraged to call the office with any questions or concerns that should arise related to todays visit.    Orders Placed This Encounter  Procedures   Microscopic Examination   Urine Culture, Reflex   Cologuard   CBC with  Differential/Platelet   CMP14+EGFR   TSH + free T4   Vitamin D (25 hydroxy)    Lipid Profile   B12 and Folate Panel   UA/M w/rflx Culture, Routine   POCT glycosylated hemoglobin (Hb A1C)    No orders of the defined types were placed in this encounter.   Return in about 3 months (around 10/28/2021) for F/U, Recheck A1C, Adrienne PCP.   Total time spent:30 Minutes Time spent includes review of chart, medications, test results, and follow up plan with the patient.   [place] Controlled Substance Database was reviewed by me.  This patient was seen by Adrienne Abernathy, FNP-C in collaboration with Dr. Fozia Khan as a part of collaborative care agreement.  Adrienne R. Abernathy, MSN, FNP-C Internal medicine  

## 2021-08-02 LAB — UA/M W/RFLX CULTURE, ROUTINE
Bilirubin, UA: NEGATIVE
Glucose, UA: NEGATIVE
Ketones, UA: NEGATIVE
Nitrite, UA: NEGATIVE
Protein,UA: NEGATIVE
RBC, UA: NEGATIVE
Specific Gravity, UA: 1.007 (ref 1.005–1.030)
Urobilinogen, Ur: 0.2 mg/dL (ref 0.2–1.0)
pH, UA: 5.5 (ref 5.0–7.5)

## 2021-08-02 LAB — MICROSCOPIC EXAMINATION
Casts: NONE SEEN /lpf
RBC, Urine: NONE SEEN /hpf (ref 0–2)

## 2021-08-02 LAB — URINE CULTURE, REFLEX

## 2021-08-06 ENCOUNTER — Telehealth: Payer: Self-pay

## 2021-08-06 NOTE — Progress Notes (Signed)
Please call the patient and ask her if she is having symptoms of a UTI.  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

## 2021-08-06 NOTE — Telephone Encounter (Signed)
-----   Message from Sallyanne Kuster, NP sent at 08/06/2021 12:08 AM EDT ----- ?Please call the patient and ask her if she is having symptoms of a UTI.  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

## 2021-08-11 DIAGNOSIS — Z1211 Encounter for screening for malignant neoplasm of colon: Secondary | ICD-10-CM | POA: Diagnosis not present

## 2021-08-19 LAB — COLOGUARD: COLOGUARD: POSITIVE — AB

## 2021-08-22 ENCOUNTER — Other Ambulatory Visit: Payer: Self-pay | Admitting: Nurse Practitioner

## 2021-08-22 DIAGNOSIS — R195 Other fecal abnormalities: Secondary | ICD-10-CM

## 2021-08-22 NOTE — Progress Notes (Signed)
Please call patient and let her know that she has a positive cologuard test and the recommendation is to do a follow up colonoscopy so I need to refer her to GI doc. Also please remind her to have her other labs drawn.

## 2021-08-25 ENCOUNTER — Other Ambulatory Visit: Payer: Self-pay

## 2021-08-26 ENCOUNTER — Telehealth: Payer: Self-pay

## 2021-08-26 NOTE — Telephone Encounter (Signed)
Patient called stating she was told [facility] does not participate with her insurance. Per Pryor Curia, [facility] is in network. Per Autumn with [facility], they are in network. I called patient back, gave her telephone # to call GI department to schedule her appointment-Toni

## 2021-08-26 NOTE — Telephone Encounter (Signed)
-----   Message from Sallyanne Kuster, NP sent at 08/22/2021  3:05 PM EDT ----- Please call patient and let her know that she has a positive cologuard test and the recommendation is to do a follow up colonoscopy so I need to refer her to GI doc. Also please remind her to have her other labs drawn.

## 2021-08-26 NOTE — Telephone Encounter (Signed)
Patient stated she is out of our network and will find a different provider who is in network

## 2021-08-26 NOTE — Telephone Encounter (Signed)
Spoke to pt a few days ago about her cologuard being positive, Sheralyn Boatman is working on referral for colonoscopy and pt is aware.

## 2021-08-27 ENCOUNTER — Other Ambulatory Visit: Payer: Self-pay

## 2021-08-27 DIAGNOSIS — R195 Other fecal abnormalities: Secondary | ICD-10-CM

## 2021-08-27 MED ORDER — PEG 3350-KCL-NA BICARB-NACL 420 G PO SOLR: 4000.0000 mL | Freq: Once | ORAL | 0 refills | Status: AC

## 2021-08-27 NOTE — Progress Notes (Signed)
Gastroenterology Pre-Procedure Review  Request Date: 09/12/2021 Requesting Physician: Dr. Vicente Males   PATIENT REVIEW QUESTIONS: The patient responded to the following health history questions as indicated:   Positive colo guard  1. Are you having any GI issues? no 2. Do you have a personal history of Polyps? no 3. Do you have a family history of Colon Cancer or Polyps? no 4. Diabetes Mellitus? yes (type 2) 5. Joint replacements in the past 12 months?no 6. Major health problems in the past 3 months?no 7. Any artificial heart valves, MVP, or defibrillator?no    MEDICATIONS & ALLERGIES:    Patient reports the following regarding taking any anticoagulation/antiplatelet therapy:   Plavix, Coumadin, Eliquis, Xarelto, Lovenox, Pradaxa, Brilinta, or Effient? no Aspirin? no  Patient confirms/reports the following medications:  Current Outpatient Medications  Medication Sig Dispense Refill   ergocalciferol (VITAMIN D2) 1.25 MG (50000 UT) capsule Take 1 capsule (50,000 Units total) by mouth once a week. 4 capsule 2   glucose blood (PRECISION QID TEST) test strip Check sugar once daily     Lancets MISC Check sugar once daily     lisinopril (ZESTRIL) 10 MG tablet Take 1 tablet (10 mg total) by mouth daily. 90 tablet 1   metFORMIN (GLUCOPHAGE) 500 MG tablet Take 1 tablet (500 mg total) by mouth daily with breakfast. 180 tablet 3   No current facility-administered medications for this visit.    Patient confirms/reports the following allergies:  No Known Allergies  No orders of the defined types were placed in this encounter.   AUTHORIZATION INFORMATION Primary Insurance: 1D#: Group #:  Secondary Insurance: 1D#: Group #:  SCHEDULE INFORMATION: Date: 09/12/2021 Time: Location:[facility]

## 2021-08-27 NOTE — Telephone Encounter (Signed)
Pt left message pretaining to her insurance

## 2021-09-11 ENCOUNTER — Encounter: Payer: Self-pay | Admitting: Gastroenterology

## 2021-09-12 ENCOUNTER — Ambulatory Visit: Payer: Medicare HMO | Admitting: Certified Registered"

## 2021-09-12 ENCOUNTER — Ambulatory Visit
Admission: RE | Admit: 2021-09-12 | Discharge: 2021-09-12 | Disposition: A | Discharge status: Home / Self Care | Payer: Medicare HMO | Attending: Gastroenterology | Admitting: Gastroenterology

## 2021-09-12 ENCOUNTER — Encounter
Admission: RE | Disposition: A | Discharge status: Home / Self Care | Payer: Self-pay | Source: Home / Self Care | Attending: Gastroenterology

## 2021-09-12 DIAGNOSIS — E119 Type 2 diabetes mellitus without complications: Secondary | ICD-10-CM | POA: Diagnosis not present

## 2021-09-12 DIAGNOSIS — Z7984 Long term (current) use of oral hypoglycemic drugs: Secondary | ICD-10-CM | POA: Insufficient documentation

## 2021-09-12 DIAGNOSIS — I10 Essential (primary) hypertension: Secondary | ICD-10-CM | POA: Diagnosis not present

## 2021-09-12 DIAGNOSIS — Z1211 Encounter for screening for malignant neoplasm of colon: Secondary | ICD-10-CM | POA: Insufficient documentation

## 2021-09-12 DIAGNOSIS — Z79899 Other long term (current) drug therapy: Secondary | ICD-10-CM | POA: Insufficient documentation

## 2021-09-12 DIAGNOSIS — R195 Other fecal abnormalities: Secondary | ICD-10-CM | POA: Diagnosis not present

## 2021-09-12 DIAGNOSIS — Z87891 Personal history of nicotine dependence: Secondary | ICD-10-CM | POA: Diagnosis not present

## 2021-09-12 HISTORY — PX: COLONOSCOPY WITH PROPOFOL: SHX5780

## 2021-09-12 LAB — GLUCOSE, CAPILLARY: Glucose-Capillary: 129 mg/dL — ABNORMAL HIGH (ref 70–99)

## 2021-09-12 SURGERY — COLONOSCOPY WITH PROPOFOL
Anesthesia: General

## 2021-09-12 MED ORDER — PROPOFOL 10 MG/ML IV BOLUS
INTRAVENOUS | Status: DC | PRN
  Administered 2021-09-12: 70 mg via INTRAVENOUS
  Administered 2021-09-12: 30 mg via INTRAVENOUS

## 2021-09-12 MED ORDER — SODIUM CHLORIDE 0.9 % IV SOLN: INTRAVENOUS | Status: DC

## 2021-09-12 MED ORDER — PROPOFOL 500 MG/50ML IV EMUL
INTRAVENOUS | Status: DC | PRN
  Administered 2021-09-12: 120 ug/kg/min via INTRAVENOUS

## 2021-09-12 MED ORDER — LIDOCAINE 2% (20 MG/ML) 5 ML SYRINGE
INTRAMUSCULAR | Status: DC | PRN
  Administered 2021-09-12: 20 mg via INTRAVENOUS

## 2021-09-12 NOTE — Anesthesia Postprocedure Evaluation (Signed)
Anesthesia Post Note  Patient: Adrienne Brown  Procedure(s) Performed: COLONOSCOPY WITH PROPOFOL  Patient location during evaluation: PACU Anesthesia Type: General Level of consciousness: awake and oriented Pain management: pain level controlled Vital Signs Assessment: post-procedure vital signs reviewed and stable Respiratory status: spontaneous breathing and respiratory function stable Cardiovascular status: stable Anesthetic complications: no   No notable events documented.   Last Vitals:  Vitals:   09/12/21 1129 09/12/21 1139  BP: 113/71 110/70  Pulse: 71 64  Resp: 14 19  Temp:    SpO2: 100% (!) 10%    Last Pain:  Vitals:   09/12/21 1139  TempSrc:   PainSc: 0-No pain                 VAN STAVEREN,Dalilah Curlin

## 2021-09-15 ENCOUNTER — Encounter: Payer: Self-pay | Admitting: Gastroenterology

## 2021-09-20 ENCOUNTER — Encounter: Payer: Self-pay | Admitting: Nurse Practitioner

## 2021-09-27 ENCOUNTER — Other Ambulatory Visit: Payer: Self-pay | Admitting: Physician Assistant

## 2021-09-27 DIAGNOSIS — E1159 Type 2 diabetes mellitus with other circulatory complications: Secondary | ICD-10-CM

## 2021-10-07 ENCOUNTER — Other Ambulatory Visit: Payer: Self-pay | Admitting: Nurse Practitioner

## 2021-10-07 DIAGNOSIS — I10 Essential (primary) hypertension: Secondary | ICD-10-CM

## 2021-10-14 DIAGNOSIS — E538 Deficiency of other specified B group vitamins: Secondary | ICD-10-CM | POA: Diagnosis not present

## 2021-10-14 DIAGNOSIS — Z1211 Encounter for screening for malignant neoplasm of colon: Secondary | ICD-10-CM | POA: Diagnosis not present

## 2021-10-14 DIAGNOSIS — R7989 Other specified abnormal findings of blood chemistry: Secondary | ICD-10-CM | POA: Diagnosis not present

## 2021-10-14 DIAGNOSIS — R300 Dysuria: Secondary | ICD-10-CM | POA: Diagnosis not present

## 2021-10-14 DIAGNOSIS — E1159 Type 2 diabetes mellitus with other circulatory complications: Secondary | ICD-10-CM | POA: Diagnosis not present

## 2021-10-14 DIAGNOSIS — E782 Mixed hyperlipidemia: Secondary | ICD-10-CM | POA: Diagnosis not present

## 2021-10-14 DIAGNOSIS — E559 Vitamin D deficiency, unspecified: Secondary | ICD-10-CM | POA: Diagnosis not present

## 2021-10-14 DIAGNOSIS — Z0001 Encounter for general adult medical examination with abnormal findings: Secondary | ICD-10-CM | POA: Diagnosis not present

## 2021-10-15 LAB — CMP14+EGFR
ALT: 22 IU/L (ref 0–32)
AST: 27 IU/L (ref 0–40)
Albumin/Globulin Ratio: 1.5 (ref 1.2–2.2)
Albumin: 4.5 g/dL (ref 3.9–4.9)
Alkaline Phosphatase: 57 IU/L (ref 44–121)
BUN/Creatinine Ratio: 14 (ref 12–28)
BUN: 9 mg/dL (ref 8–27)
Bilirubin Total: <0.2 mg/dL (ref 0.0–1.2)
CO2: 23 mmol/L (ref 20–29)
Calcium: 9.4 mg/dL (ref 8.7–10.3)
Chloride: 102 mmol/L (ref 96–106)
Creatinine, Ser: 0.65 mg/dL (ref 0.57–1.00)
Globulin, Total: 3 g/dL (ref 1.5–4.5)
Glucose: 155 mg/dL — ABNORMAL HIGH (ref 70–99)
Potassium: 4.4 mmol/L (ref 3.5–5.2)
Sodium: 139 mmol/L (ref 134–144)
Total Protein: 7.5 g/dL (ref 6.0–8.5)
eGFR: 97 mL/min/{1.73_m2} (ref >59)

## 2021-10-15 LAB — CBC WITH DIFFERENTIAL/PLATELET
Basophils Absolute: 0.1 10*3/uL (ref 0.0–0.2)
Basos: 1 %
EOS (ABSOLUTE): 0.1 10*3/uL (ref 0.0–0.4)
Eos: 2 %
Hematocrit: 37.2 % (ref 34.0–46.6)
Hemoglobin: 12.1 g/dL (ref 11.1–15.9)
Immature Grans (Abs): 0 10*3/uL (ref 0.0–0.1)
Immature Granulocytes: 0 %
Lymphocytes Absolute: 2.5 10*3/uL (ref 0.7–3.1)
Lymphs: 51 %
MCH: 28.1 pg (ref 26.6–33.0)
MCHC: 32.5 g/dL (ref 31.5–35.7)
MCV: 86 fL (ref 79–97)
Monocytes Absolute: 0.5 10*3/uL (ref 0.1–0.9)
Monocytes: 9 %
Neutrophils Absolute: 1.8 10*3/uL (ref 1.4–7.0)
Neutrophils: 37 %
Platelets: 243 10*3/uL (ref 150–450)
RBC: 4.31 x10E6/uL (ref 3.77–5.28)
RDW: 13.2 % (ref 11.7–15.4)
WBC: 4.9 10*3/uL (ref 3.4–10.8)

## 2021-10-15 LAB — TSH+FREE T4
Free T4: 1.09 ng/dL (ref 0.82–1.77)
TSH: 2.43 u[IU]/mL (ref 0.450–4.500)

## 2021-10-15 LAB — LIPID PANEL
Chol/HDL Ratio: 5.2 ratio — ABNORMAL HIGH (ref 0.0–4.4)
Cholesterol, Total: 206 mg/dL — ABNORMAL HIGH (ref 100–199)
HDL: 40 mg/dL (ref >39)
LDL Chol Calc (NIH): 123 mg/dL — ABNORMAL HIGH (ref 0–99)
Triglycerides: 243 mg/dL — ABNORMAL HIGH (ref 0–149)
VLDL Cholesterol Cal: 43 mg/dL — ABNORMAL HIGH (ref 5–40)

## 2021-10-15 LAB — VITAMIN D 25 HYDROXY (VIT D DEFICIENCY, FRACTURES): Vit D, 25-Hydroxy: 20 ng/mL — ABNORMAL LOW (ref 30.0–100.0)

## 2021-10-15 LAB — B12 AND FOLATE PANEL
Folate: 7.4 ng/mL (ref >3.0)
Vitamin B-12: 305 pg/mL (ref 232–1245)

## 2021-10-22 NOTE — Progress Notes (Signed)
Will discuss at upcoming appt on 8/7

## 2021-10-27 ENCOUNTER — Encounter: Payer: Self-pay | Admitting: Nurse Practitioner

## 2021-10-27 ENCOUNTER — Ambulatory Visit (INDEPENDENT_AMBULATORY_CARE_PROVIDER_SITE_OTHER): Payer: Medicare HMO | Admitting: Nurse Practitioner

## 2021-10-27 VITALS — BP 140/85 | HR 96 | Temp 98.0°F | Resp 16 | Ht 64.5 in | Wt 154.4 lb

## 2021-10-27 DIAGNOSIS — E782 Mixed hyperlipidemia: Secondary | ICD-10-CM

## 2021-10-27 DIAGNOSIS — E559 Vitamin D deficiency, unspecified: Secondary | ICD-10-CM

## 2021-10-27 DIAGNOSIS — E1159 Type 2 diabetes mellitus with other circulatory complications: Secondary | ICD-10-CM | POA: Diagnosis not present

## 2021-10-27 DIAGNOSIS — I10 Essential (primary) hypertension: Secondary | ICD-10-CM

## 2021-10-27 LAB — POCT GLYCOSYLATED HEMOGLOBIN (HGB A1C): Hemoglobin A1C: 6.9 % — AB (ref 4.0–5.6)

## 2021-10-27 NOTE — Progress Notes (Signed)
[facility] [address] [place], [place] [address]  Internal MEDICINE  Office Visit Note  Patient Name: Adrienne Brown  [id number]  [id number]  Date of Service: 10/27/2021  Chief Complaint  Patient presents with   Follow-up   Diabetes   Hypertension   Quality Metric Gaps    Eye Exam and Foot Exam    HPI Telisa presents for a follow up visit for diabetes, hypertension, hyperlipidemia, and vitamin D deficiency.  Diabetes -- A1c is increased by 0.4 from prior in may. Has been lax with diet, eating more foods high in carbs and sugar. Gained 4 lbs since previous office visit.  Hypertension -- controlled with current medications Hyperlipidemia -- elevated lipid panel as of 2 months ago, working on previously discussed diet modifications, briefly reviewed. Not on statin therapy currently.  Low vitamin D -- taking weekly prescription vitamin D supplement.     Current Medication: Outpatient Encounter Medications as of 10/27/2021  Medication Sig   ergocalciferol (VITAMIN D2) 1.25 MG (50000 UT) capsule Take 1 capsule (50,000 Units total) by mouth once a week.   glucose blood (PRECISION QID TEST) test strip Check sugar once daily   Lancets MISC Check sugar once daily   lisinopril (ZESTRIL) 10 MG tablet TAKE 1 TABLET BY MOUTH EVERY DAY   metFORMIN (GLUCOPHAGE) 500 MG tablet TAKE 1 TABLET BY MOUTH EVERY DAY WITH BREAKFAST   No facility-administered encounter medications on file as of 10/27/2021.    Surgical History: Past Surgical History:  Procedure Laterality Date   COLONOSCOPY WITH PROPOFOL N/A 09/12/2021   Procedure: COLONOSCOPY WITH PROPOFOL;  Surgeon: Wyline Mood, MD;  Location: [facility] ENDOSCOPY;  Service: Gastroenterology;  Laterality: N/A;   GALLBLADDER SURGERY  2013    Medical History: Past Medical History:  Diagnosis Date   Hypertension    Type II diabetes mellitus (HCC)     Family History: Family History  Problem Relation Age of Onset   Hyperthyroidism  Mother        61 yrs old, smokes cigs and alcohol   Hypertension Mother    Diabetes Mother    Diabetes Father    Intestinal malrotation Father        intestinal blockage   Dementia Father     Social History   Socioeconomic History   Marital status: Unknown    Spouse name: Not on file   Number of children: Not on file   Years of education: Not on file   Highest education level: Not on file  Occupational History   Not on file  Tobacco Use   Smoking status: Former    Types: Cigarettes    Quit date: 03/23/2004    Years since quitting: 17.7   Smokeless tobacco: Former   Tobacco comments:    does nicorett at times, quit 2006  Vaping Use   Vaping Use: Never used  Substance and Sexual Activity   Alcohol use: Yes    Alcohol/week: 2.0 standard drinks of alcohol    Types: 2 Glasses of wine per week    Comment: occ   Drug use: Never   Sexual activity: Not on file  Other Topics Concern   Not on file  Social History Narrative   Not on file   Social Determinants of Health   Financial Resource Strain: Not on file  Food Insecurity: Not on file  Transportation Needs: Not on file  Physical Activity: Not on file  Stress: Not on file  Social Connections: Not on file  Intimate Partner Violence: Not on file      Review of Systems  Constitutional:  Negative for chills, fatigue and unexpected weight change.  HENT:  Negative for congestion, rhinorrhea, sneezing and sore throat.   Eyes:  Negative for redness.  Respiratory:  Negative for cough, chest tightness and shortness of breath.   Cardiovascular:  Negative for chest pain and palpitations.  Gastrointestinal:  Negative for abdominal pain, constipation, diarrhea, nausea and vomiting.  Genitourinary:  Negative for dysuria and frequency.  Musculoskeletal:  Negative for arthralgias, back pain, joint swelling and neck pain.  Skin:  Negative for rash.  Neurological: Negative.  Negative for tremors and numbness.  Hematological:   Negative for adenopathy. Does not bruise/bleed easily.  Psychiatric/Behavioral:  Negative for behavioral problems (Depression), sleep disturbance and suicidal ideas. The patient is not nervous/anxious.     Vital Signs: BP (!) 140/85   Pulse 96   Temp 98 F (36.7 C)   Resp 16   Ht 5' 4.5" (1.638 m)   Wt 154 lb 6.4 oz (70 kg)   SpO2 99%   BMI 26.09 kg/m    Physical Exam Vitals reviewed.  Constitutional:      General: She is not in acute distress.    Appearance: Normal appearance. She is normal weight. She is not ill-appearing.  HENT:     Head: Normocephalic and atraumatic.  Eyes:     Pupils: Pupils are equal, round, and reactive to light.  Cardiovascular:     Rate and Rhythm: Normal rate and regular rhythm.  Pulmonary:     Effort: Pulmonary effort is normal. No respiratory distress.  Neurological:     Mental Status: She is alert and oriented to person, place, and time.  Psychiatric:        Mood and Affect: Mood normal.        Behavior: Behavior normal.        Assessment/Plan: 1. Controlled type 2 diabetes mellitus with other circulatory complication, without long-term current use of insulin (HCC) Slightly increased a1c, continue medications as prescribed, patient to work on diet and lifestyle modifications as discussed. Repeat a1c in 3 months - POCT HgB A1C  2. Essential hypertension Stable, continue medications as prescribed  3. Mixed hyperlipidemia Continue diet and lifestyle modifications as discussed  4. Vitamin D deficiency Continue weekly vitamin D supplement, will repeat vitamin D level in 3 months   General Counseling: Arli verbalizes understanding of the findings of todays visit and agrees with plan of treatment. I have discussed any further diagnostic evaluation that may be needed or ordered today. We also reviewed her medications today. she has been encouraged to call the office with any questions or concerns that should arise related to todays  visit.    Orders Placed This Encounter  Procedures   POCT HgB A1C    No orders of the defined types were placed in this encounter.   Return in about 3 months (around 01/27/2022) for F/U, Recheck A1C, Jeramia Saleeby PCP.   Total time spent:30 Minutes Time spent includes review of chart, medications, test results, and follow up plan with the patient.   [place] Controlled Substance Database was reviewed by me.  This patient was seen by Sallyanne Kuster, FNP-C in collaboration with Dr. Beverely Risen as a part of collaborative care agreement.   Russ Looper R. Tedd Sias, MSN, FNP-C Internal medicine

## 2021-12-01 DIAGNOSIS — H524 Presbyopia: Secondary | ICD-10-CM | POA: Diagnosis not present

## 2021-12-01 DIAGNOSIS — H5203 Hypermetropia, bilateral: Secondary | ICD-10-CM | POA: Diagnosis not present

## 2021-12-01 DIAGNOSIS — H2513 Age-related nuclear cataract, bilateral: Secondary | ICD-10-CM | POA: Diagnosis not present

## 2021-12-01 DIAGNOSIS — E119 Type 2 diabetes mellitus without complications: Secondary | ICD-10-CM | POA: Diagnosis not present

## 2021-12-01 DIAGNOSIS — Z0100 Encounter for examination of eyes and vision without abnormal findings: Secondary | ICD-10-CM | POA: Diagnosis not present

## 2021-12-13 ENCOUNTER — Encounter: Payer: Self-pay | Admitting: Nurse Practitioner

## 2021-12-18 DIAGNOSIS — L309 Dermatitis, unspecified: Secondary | ICD-10-CM | POA: Diagnosis not present

## 2021-12-24 ENCOUNTER — Ambulatory Visit: Payer: Medicare HMO

## 2021-12-26 ENCOUNTER — Telehealth: Payer: Self-pay | Admitting: Nurse Practitioner

## 2022-01-15 NOTE — Telephone Encounter (Signed)
Error

## 2022-01-26 ENCOUNTER — Ambulatory Visit: Payer: Medicare HMO | Admitting: Nurse Practitioner

## 2022-02-04 ENCOUNTER — Ambulatory Visit (INDEPENDENT_AMBULATORY_CARE_PROVIDER_SITE_OTHER): Payer: Medicare HMO | Admitting: Nurse Practitioner

## 2022-02-04 ENCOUNTER — Encounter: Payer: Self-pay | Admitting: Nurse Practitioner

## 2022-02-04 VITALS — BP 139/76 | HR 94 | Temp 98.3°F | Resp 16 | Ht 64.5 in | Wt 149.4 lb

## 2022-02-04 DIAGNOSIS — I10 Essential (primary) hypertension: Secondary | ICD-10-CM

## 2022-02-04 DIAGNOSIS — E1159 Type 2 diabetes mellitus with other circulatory complications: Secondary | ICD-10-CM

## 2022-02-04 DIAGNOSIS — Z636 Dependent relative needing care at home: Secondary | ICD-10-CM

## 2022-02-04 LAB — POCT GLYCOSYLATED HEMOGLOBIN (HGB A1C): Hemoglobin A1C: 6.4 % — AB (ref 4.0–5.6)

## 2022-02-04 NOTE — Progress Notes (Signed)
[facility] [address] [place], [place] [address]  Internal MEDICINE  Office Visit Note  Patient Name: Adrienne Brown  [id number]  [id number]  Date of Service: 02/04/2022  Chief Complaint  Patient presents with   Follow-up   Hypertension   Diabetes    HPI Adrienne Brown presents for a follow up visit for diabetes and hypertension.  Diabetes -- A1c improved to 6.4, lost 5 lbs since last visit. Has been walking more, limiting grapes and other sugars, eating healthier, stopped drinking juices.  Hypertension -- stable with current medications  Caregiver stress -- her mother has dementia, and is developing aggressive behavior. Her sister is relieving her this week to give her a break.     Current Medication: Outpatient Encounter Medications as of 02/04/2022  Medication Sig   ergocalciferol (VITAMIN D2) 1.25 MG (50000 UT) capsule Take 1 capsule (50,000 Units total) by mouth once a week.   glucose blood (PRECISION QID TEST) test strip Check sugar once daily   Lancets MISC Check sugar once daily   lisinopril (ZESTRIL) 10 MG tablet TAKE 1 TABLET BY MOUTH EVERY DAY   metFORMIN (GLUCOPHAGE) 500 MG tablet TAKE 1 TABLET BY MOUTH EVERY DAY WITH BREAKFAST   No facility-administered encounter medications on file as of 02/04/2022.    Surgical History: Past Surgical History:  Procedure Laterality Date   COLONOSCOPY WITH PROPOFOL N/A 09/12/2021   Procedure: COLONOSCOPY WITH PROPOFOL;  Surgeon: Wyline Mood, MD;  Location: [facility] ENDOSCOPY;  Service: Gastroenterology;  Laterality: N/A;   GALLBLADDER SURGERY  2013    Medical History: Past Medical History:  Diagnosis Date   Hypertension    Type II diabetes mellitus (HCC)     Family History: Family History  Problem Relation Age of Onset   Hyperthyroidism Mother        57 yrs old, smokes cigs and alcohol   Hypertension Mother    Diabetes Mother    Diabetes Father    Intestinal malrotation Father        intestinal blockage    Dementia Father     Social History   Socioeconomic History   Marital status: Unknown    Spouse name: Not on file   Number of children: Not on file   Years of education: Not on file   Highest education level: Not on file  Occupational History   Not on file  Tobacco Use   Smoking status: Former    Types: Cigarettes    Quit date: 03/23/2004    Years since quitting: 17.8   Smokeless tobacco: Former   Tobacco comments:    does nicorett at times, quit 2006  Vaping Use   Vaping Use: Never used  Substance and Sexual Activity   Alcohol use: Yes    Alcohol/week: 2.0 standard drinks of alcohol    Types: 2 Glasses of wine per week    Comment: occ   Drug use: Never   Sexual activity: Not on file  Other Topics Concern   Not on file  Social History Narrative   Not on file   Social Determinants of Health   Financial Resource Strain: Not on file  Food Insecurity: Not on file  Transportation Needs: Not on file  Physical Activity: Not on file  Stress: Not on file  Social Connections: Not on file  Intimate Partner Violence: Not on file      Review of Systems  Constitutional:  Negative for chills, fatigue and unexpected weight change.  HENT:  Negative for  congestion, rhinorrhea, sneezing and sore throat.   Eyes:  Negative for redness.  Respiratory:  Negative for cough, chest tightness and shortness of breath.   Cardiovascular:  Negative for chest pain and palpitations.  Gastrointestinal:  Negative for abdominal pain, constipation, diarrhea, nausea and vomiting.  Genitourinary:  Negative for dysuria and frequency.  Musculoskeletal:  Negative for arthralgias, back pain, joint swelling and neck pain.  Skin:  Negative for rash.  Neurological: Negative.  Negative for tremors and numbness.  Hematological:  Negative for adenopathy. Does not bruise/bleed easily.  Psychiatric/Behavioral:  Negative for behavioral problems (Depression), sleep disturbance and suicidal ideas. The patient  is not nervous/anxious.     Vital Signs: BP 139/76   Pulse 94   Temp 98.3 F (36.8 C)   Resp 16   Ht 5' 4.5" (1.638 m)   Wt 149 lb 6.4 oz (67.8 kg)   SpO2 99%   BMI 25.25 kg/m    Physical Exam Vitals reviewed.  Constitutional:      General: She is not in acute distress.    Appearance: Normal appearance. She is normal weight. She is not ill-appearing.  HENT:     Head: Normocephalic and atraumatic.  Eyes:     Pupils: Pupils are equal, round, and reactive to light.  Cardiovascular:     Rate and Rhythm: Normal rate and regular rhythm.  Pulmonary:     Effort: Pulmonary effort is normal. No respiratory distress.  Neurological:     Mental Status: She is alert and oriented to person, place, and time.  Psychiatric:        Mood and Affect: Mood normal.        Behavior: Behavior normal.        Assessment/Plan: 1. Controlled type 2 diabetes mellitus with other circulatory complication, without long-term current use of insulin (HCC) A1c slightly improved. No changes, continue medications as prescribed. Follow up in 3 months to repeat A1c.  - POCT glycosylated hemoglobin (Hb A1C)  2. Essential hypertension Stable, continue medications as prescribed  3. Caregiver stress Take breaks and make time for self care as discussed   General Counseling: Adrienne Brown verbalizes understanding of the findings of todays visit and agrees with plan of treatment. I have discussed any further diagnostic evaluation that may be needed or ordered today. We also reviewed her medications today. she has been encouraged to call the office with any questions or concerns that should arise related to todays visit.    Orders Placed This Encounter  Procedures   POCT glycosylated hemoglobin (Hb A1C)    No orders of the defined types were placed in this encounter.   Return in about 3 months (around 05/07/2022) for F/U, Recheck A1C, [place] Fant PCP.   Total time spent:30 Minutes Time spent includes review  of chart, medications, test results, and follow up plan with the patient.   [place] Controlled Substance Database was reviewed by me.  This patient was seen by Jonetta Osgood, FNP-C in collaboration with Dr. Clayborn Bigness as a part of collaborative care agreement.   Adrienne Brown R. Valetta Fuller, MSN, FNP-C Internal medicine

## 2022-03-22 ENCOUNTER — Encounter: Payer: Self-pay | Admitting: Nurse Practitioner

## 2022-03-25 ENCOUNTER — Other Ambulatory Visit: Payer: Self-pay | Admitting: Physician Assistant

## 2022-03-25 DIAGNOSIS — E1159 Type 2 diabetes mellitus with other circulatory complications: Secondary | ICD-10-CM

## 2022-04-16 ENCOUNTER — Other Ambulatory Visit: Payer: Self-pay | Admitting: Nurse Practitioner

## 2022-04-16 DIAGNOSIS — I10 Essential (primary) hypertension: Secondary | ICD-10-CM

## 2022-05-06 ENCOUNTER — Ambulatory Visit: Payer: Medicare HMO | Admitting: Nurse Practitioner

## 2022-05-26 ENCOUNTER — Ambulatory Visit: Payer: Medicare HMO | Admitting: Nurse Practitioner

## 2022-05-26 ENCOUNTER — Encounter: Payer: Self-pay | Admitting: Pharmacist

## 2022-05-26 NOTE — Progress Notes (Signed)
[place] Team Statin Quality Measure Assessment   05/26/2022  Adrienne Brown Feb 10, 1956 [id number]  Per review of chart and payor information, Adrienne Brown has a diagnosis of diabetes but is not currently filling a statin prescription.  This places patient into the Statin Use In Patients with Diabetes (SUPD) measure for CMS.    I could not find any documentation of previous trial of a statin or a history of statin intolerance.Her calculated 10-year ASCVD risk score (Arnett DK, et al., 2019) is: 28.1%.  Please consider discussing statin therapy with the patient at [place] office visit if you feel it is clinically appropriate.  Please consider ONE of the following recommendations:  Initiate moderate intensity statin Atorvastatin 10 mg once daily, #90, 3 refills   Rosuvastatin 5 mg once daily, #90, 3 refills    Initiate low intensity          statin with reduced frequency if prior          statin intolerance 1x weekly, #13, 3 refills   2x weekly, #26, 3 refills   3x weekly, #39, 3 refills    Code for past statin intolerance or  other exclusions (required annually)  Provider Requirements: Associate code during an office visit or telehealth encounter  Drug Induced Myopathy G72.0   Myopathy, unspecified G72.9   Myositis, unspecified M60.9   Rhabdomyolysis M62.82   Cirrhosis of liver K74.69   Prediabetes R73.03   PCOS E28.2   Thank you for allowing [facility] pharmacy to be a part of this patient's care. Curlene Labrum, PharmD [facility] Pharmacist Office: [phone]

## 2022-05-27 ENCOUNTER — Encounter: Payer: Self-pay | Admitting: Nurse Practitioner

## 2022-05-27 ENCOUNTER — Ambulatory Visit (INDEPENDENT_AMBULATORY_CARE_PROVIDER_SITE_OTHER): Payer: Medicare HMO | Admitting: Nurse Practitioner

## 2022-05-27 VITALS — BP 120/75 | HR 100 | Temp 98.5°F | Resp 16 | Ht 64.5 in | Wt 148.6 lb

## 2022-05-27 DIAGNOSIS — E1159 Type 2 diabetes mellitus with other circulatory complications: Secondary | ICD-10-CM

## 2022-05-27 DIAGNOSIS — I10 Essential (primary) hypertension: Secondary | ICD-10-CM

## 2022-05-27 DIAGNOSIS — E559 Vitamin D deficiency, unspecified: Secondary | ICD-10-CM

## 2022-05-27 LAB — POCT GLYCOSYLATED HEMOGLOBIN (HGB A1C): Hemoglobin A1C: 6.4 % — AB (ref 4.0–5.6)

## 2022-05-27 NOTE — Progress Notes (Signed)
[facility] [place], [place] [address]  Internal MEDICINE  Office Visit Note  Patient Name: Adrienne Brown  [id number]  [id number]  Date of Service: 05/27/2022  Chief Complaint  Patient presents with   Follow-up   Diabetes   Hypertension    HPI Adrienne Brown presents for a follow-up visit for diabetes  Diabetes -- A1c unchanged 6.4 which remains stable. Taking metformin once daily White coat syndrome -- has hypertension and is taking lisinopril. BP was initially elevated but improved when rechecked. Has white coat syndrome when at the office sometimes.  Vitamin D deficiency -- taking weekly prescription supplement.    Current Medication: Outpatient Encounter Medications as of 05/27/2022  Medication Sig   ergocalciferol (VITAMIN D2) 1.25 MG (50000 UT) capsule Take 1 capsule (50,000 Units total) by mouth once a week.   glucose blood (PRECISION QID TEST) test strip Check sugar once daily   Lancets MISC Check sugar once daily   lisinopril (ZESTRIL) 10 MG tablet TAKE 1 TABLET BY MOUTH EVERY DAY   metFORMIN (GLUCOPHAGE) 500 MG tablet TAKE 1 TABLET BY MOUTH EVERY DAY WITH BREAKFAST   No facility-administered encounter medications on file as of 05/27/2022.    Surgical History: Past Surgical History:  Procedure Laterality Date   COLONOSCOPY WITH PROPOFOL N/A 09/12/2021   Procedure: COLONOSCOPY WITH PROPOFOL;  Surgeon: Jonathon Bellows, MD;  Location: [facility] ENDOSCOPY;  Service: Gastroenterology;  Laterality: N/A;   GALLBLADDER SURGERY  2013    Medical History: Past Medical History:  Diagnosis Date   Hypertension    Type II diabetes mellitus ([place])     Family History: Family History  Problem Relation Age of Onset   Hyperthyroidism Mother        16 yrs old, smokes cigs and alcohol   Hypertension Mother    Diabetes Mother    Diabetes Father    Intestinal malrotation Father        intestinal blockage   Dementia Father     Social History   Socioeconomic  History   Marital status: Unknown    Spouse name: Not on file   Number of children: Not on file   Years of education: Not on file   Highest education level: Not on file  Occupational History   Not on file  Tobacco Use   Smoking status: Former    Types: Cigarettes    Quit date: 03/23/2004    Years since quitting: 18.1   Smokeless tobacco: Former   Tobacco comments:    does nicorett at times, quit 2006  Vaping Use   Vaping Use: Never used  Substance and Sexual Activity   Alcohol use: Not Currently    Alcohol/week: 2.0 standard drinks of alcohol    Types: 2 Glasses of wine per week    Comment: occ   Drug use: Never   Sexual activity: Not on file  Other Topics Concern   Not on file  Social History Narrative   Not on file   Social Determinants of Health   Financial Resource Strain: Not on file  Food Insecurity: Not on file  Transportation Needs: Not on file  Physical Activity: Not on file  Stress: Not on file  Social Connections: Not on file  Intimate Partner Violence: Not on file      Review of Systems  Constitutional:  Negative for chills, fatigue and unexpected weight change.  HENT:  Negative for congestion, rhinorrhea, sneezing and sore throat.   Eyes:  Negative for redness.  Respiratory:  Negative for cough, chest tightness and shortness of breath.   Cardiovascular:  Negative for chest pain and palpitations.  Gastrointestinal:  Negative for abdominal pain, constipation, diarrhea, nausea and vomiting.  Genitourinary:  Negative for dysuria and frequency.  Musculoskeletal:  Negative for arthralgias, back pain, joint swelling and neck pain.  Skin:  Negative for rash.  Neurological: Negative.  Negative for tremors and numbness.  Hematological:  Negative for adenopathy. Does not bruise/bleed easily.  Psychiatric/Behavioral:  Negative for behavioral problems (Depression), sleep disturbance and suicidal ideas. The patient is not nervous/anxious.     Vital Signs: BP  120/75 Comment: 145/79  Pulse 100   Temp 98.5 F (36.9 C)   Resp 16   Ht 5' 4.5" (1.638 m)   Wt 148 lb 9.6 oz (67.4 kg)   SpO2 99%   BMI 25.11 kg/m    Physical Exam Vitals reviewed.  Constitutional:      General: She is not in acute distress.    Appearance: Normal appearance. She is not ill-appearing.  HENT:     Head: Normocephalic and atraumatic.  Eyes:     Pupils: Pupils are equal, round, and reactive to light.  Cardiovascular:     Rate and Rhythm: Normal rate and regular rhythm.  Pulmonary:     Effort: Pulmonary effort is normal. No respiratory distress.  Neurological:     Mental Status: She is alert and oriented to person, place, and time.  Psychiatric:        Mood and Affect: Mood normal.        Behavior: Behavior normal.        Assessment/Plan: 1. Controlled type 2 diabetes mellitus with other circulatory complication, without long-term current use of insulin (HCC) Stable continue current diet and lifestyle modifications. Continue metformin as prescribed. Repeat A1c in 6 months  - POCT glycosylated hemoglobin (Hb A1C)  2. Essential hypertension Stable, continue lisinopril as prescribed.   3. Vitamin D deficiency Continue weekly vitamin D prescription supplement. Will repeat vitamin D level in a few months with annual wellness visit.    General Counseling: Adrienne Brown verbalizes understanding of the findings of todays visit and agrees with plan of treatment. I have discussed any further diagnostic evaluation that may be needed or ordered today. We also reviewed her medications today. she has been encouraged to call the office with any questions or concerns that should arise related to todays visit.    Orders Placed This Encounter  Procedures   POCT glycosylated hemoglobin (Hb A1C)    No orders of the defined types were placed in this encounter.   Return for previously scheduled, CPE, Adrienne Brown PCP in may .   Total time spent:30 Minutes Time spent includes  review of chart, medications, test results, and follow up plan with the patient.   [place] Controlled Substance Database was reviewed by me.  This patient was seen by Jonetta Osgood, FNP-C in collaboration with Dr. Clayborn Bigness as a part of collaborative care agreement.   Beronica Lansdale R. Valetta Fuller, MSN, FNP-C Internal medicine

## 2022-06-13 ENCOUNTER — Encounter: Payer: Self-pay | Admitting: Nurse Practitioner

## 2022-07-30 ENCOUNTER — Ambulatory Visit (INDEPENDENT_AMBULATORY_CARE_PROVIDER_SITE_OTHER): Payer: Medicare HMO | Admitting: Nurse Practitioner

## 2022-07-30 ENCOUNTER — Encounter: Payer: Self-pay | Admitting: Nurse Practitioner

## 2022-07-30 VITALS — BP 135/75 | HR 87 | Resp 16 | Ht 64.5 in | Wt 149.2 lb

## 2022-07-30 DIAGNOSIS — Z0001 Encounter for general adult medical examination with abnormal findings: Secondary | ICD-10-CM

## 2022-07-30 DIAGNOSIS — R300 Dysuria: Secondary | ICD-10-CM

## 2022-07-30 DIAGNOSIS — E1159 Type 2 diabetes mellitus with other circulatory complications: Secondary | ICD-10-CM | POA: Diagnosis not present

## 2022-07-30 DIAGNOSIS — E538 Deficiency of other specified B group vitamins: Secondary | ICD-10-CM

## 2022-07-30 DIAGNOSIS — E559 Vitamin D deficiency, unspecified: Secondary | ICD-10-CM

## 2022-07-30 DIAGNOSIS — I10 Essential (primary) hypertension: Secondary | ICD-10-CM

## 2022-07-30 DIAGNOSIS — E782 Mixed hyperlipidemia: Secondary | ICD-10-CM

## 2022-07-30 MED ORDER — LISINOPRIL 10 MG PO TABS: 10.0000 mg | ORAL_TABLET | Freq: Every day | ORAL | 1 refills | Status: DC

## 2022-07-30 MED ORDER — METFORMIN HCL 500 MG PO TABS: ORAL_TABLET | ORAL | 1 refills | Status: DC

## 2022-07-30 NOTE — Progress Notes (Signed)
[facility] [address] [place], [place] [address]  Internal MEDICINE  Office Visit Note  Patient Name: Adrienne Brown  [id number]  [id number]  Date of Service: 07/30/2022  Chief Complaint  Patient presents with   Diabetes   Hypertension   Medicare Wellness    HPI Adrienne Brown presents for an annual well visit and physical exam.  Well-appearing 67 y.o. female with hypertension and type 2 diabetes.  Routine CRC screening: done in 2023, after having a positive cologuard test.  Routine mammogram: done in 2022  DEXA scan: done in 2022 Eye exam in the past year, large cataract on left eye.   foot exam: done today  Labs: due in July  New or worsening pain: none  Most recent A1c is 6.4 in march which is no change from A1c in November 2023.       07/30/2022   11:13 AM 07/28/2021   11:28 AM  MMSE - Mini Mental State Exam  Orientation to time 5 5  Orientation to Place 5 5  Registration 3 3  Attention/ Calculation 5 5  Recall 3 3  Language- name 2 objects 2 2  Language- repeat 1 1  Language- follow 3 step command 3 3  Language- read & follow direction 1 1  Write a sentence 1 1  Copy design 1 1  Total score 30 30    Functional Status Survey: Is the patient deaf or have difficulty hearing?: No Does the patient have difficulty seeing, even when wearing glasses/contacts?: Yes Does the patient have difficulty concentrating, remembering, or making decisions?: Yes Does the patient have difficulty walking or climbing stairs?: No Does the patient have difficulty dressing or bathing?: No Does the patient have difficulty doing errands alone such as visiting a doctor's office or shopping?: No     07/28/2021   11:25 AM 10/27/2021   12:02 PM 02/04/2022    1:54 PM 05/27/2022    2:24 PM 07/30/2022   11:12 AM  Fall Risk  Falls in the past year? 1 0 0 0 0  Was there an injury with Fall? 1  0 0 0  Was there an injury with Fall? - Comments bruised tailbone      Fall Risk Category  Calculator 2  0 0 0  Fall Risk Category (Retired) Moderate  Low    (RETIRED) Patient Fall Risk Level   Low fall risk    Patient at Risk for Falls Due to   No Fall Risks No Fall Risks No Fall Risks  Fall risk Follow up   Falls evaluation completed Falls evaluation completed Falls evaluation completed       07/30/2022   11:12 AM  Depression screen PHQ 2/9  Decreased Interest 0  Down, Depressed, Hopeless 0  PHQ - 2 Score 0       Current Medication: Outpatient Encounter Medications as of 07/30/2022  Medication Sig   ergocalciferol (VITAMIN D2) 1.25 MG (50000 UT) capsule Take 1 capsule (50,000 Units total) by mouth once a week.   glucose blood (PRECISION QID TEST) test strip Check sugar once daily   Lancets MISC Check sugar once daily   [DISCONTINUED] lisinopril (ZESTRIL) 10 MG tablet TAKE 1 TABLET BY MOUTH EVERY DAY   [DISCONTINUED] metFORMIN (GLUCOPHAGE) 500 MG tablet TAKE 1 TABLET BY MOUTH EVERY DAY WITH BREAKFAST   lisinopril (ZESTRIL) 10 MG tablet Take 1 tablet (10 mg total) by mouth daily.   metFORMIN (GLUCOPHAGE) 500 MG tablet TAKE 1 TABLET BY MOUTH  EVERY DAY WITH BREAKFAST   No facility-administered encounter medications on file as of 07/30/2022.    Surgical History: Past Surgical History:  Procedure Laterality Date   COLONOSCOPY WITH PROPOFOL N/A 09/12/2021   Procedure: COLONOSCOPY WITH PROPOFOL;  Surgeon: Wyline Mood, MD;  Location: [facility] ENDOSCOPY;  Service: Gastroenterology;  Laterality: N/A;   GALLBLADDER SURGERY  2013    Medical History: Past Medical History:  Diagnosis Date   Hypertension    Type II diabetes mellitus (HCC)     Family History: Family History  Problem Relation Age of Onset   Hyperthyroidism Mother        74 yrs old, smokes cigs and alcohol   Hypertension Mother    Diabetes Mother    Diabetes Father    Intestinal malrotation Father        intestinal blockage   Dementia Father     Social History   Socioeconomic History   Marital status:  Unknown    Spouse name: Not on file   Number of children: Not on file   Years of education: Not on file   Highest education level: Not on file  Occupational History   Not on file  Tobacco Use   Smoking status: Former    Types: Cigarettes    Quit date: 03/23/2004    Years since quitting: 18.3   Smokeless tobacco: Former   Tobacco comments:    does nicorett at times, quit 2006  Vaping Use   Vaping Use: Never used  Substance and Sexual Activity   Alcohol use: Not Currently    Alcohol/week: 2.0 standard drinks of alcohol    Types: 2 Glasses of wine per week    Comment: occ   Drug use: Never   Sexual activity: Not on file  Other Topics Concern   Not on file  Social History Narrative   Not on file   Social Determinants of Health   Financial Resource Strain: Not on file  Food Insecurity: Not on file  Transportation Needs: Not on file  Physical Activity: Not on file  Stress: Not on file  Social Connections: Not on file  Intimate Partner Violence: Not on file      Review of Systems  Constitutional:  Negative for activity change, appetite change, chills, fatigue, fever and unexpected weight change.  HENT: Negative.  Negative for congestion, ear pain, rhinorrhea, sore throat and trouble swallowing.   Eyes: Negative.   Respiratory: Negative.  Negative for cough, chest tightness, shortness of breath and wheezing.   Cardiovascular: Negative.  Negative for chest pain.  Gastrointestinal: Negative.  Negative for abdominal pain, blood in stool, constipation, diarrhea, nausea and vomiting.  Endocrine: Negative.   Genitourinary: Negative.  Negative for difficulty urinating, dysuria, frequency, hematuria and urgency.  Musculoskeletal: Negative.  Negative for arthralgias, back pain, joint swelling, myalgias and neck pain.  Skin: Negative.  Negative for rash and wound.  Allergic/Immunologic: Negative.  Negative for immunocompromised state.  Neurological: Negative.  Negative for  dizziness, seizures, numbness and headaches.  Hematological: Negative.   Psychiatric/Behavioral: Negative.  Negative for behavioral problems, self-injury and suicidal ideas. The patient is not nervous/anxious.     Vital Signs: BP 135/75 Comment: 158/72  Pulse 87   Resp 16   Ht 5' 4.5" (1.638 m)   Wt 149 lb 3.2 oz (67.7 kg)   SpO2 97%   BMI 25.21 kg/m    Physical Exam Vitals reviewed.  Constitutional:      General: She is awake. She is not  in acute distress.    Appearance: Normal appearance. She is well-developed, well-groomed and normal weight. She is not ill-appearing or diaphoretic.  HENT:     Head: Normocephalic and atraumatic.     Right Ear: Tympanic membrane, ear canal and external ear normal.     Left Ear: Tympanic membrane, ear canal and external ear normal.     Nose: Nose normal.     Mouth/Throat:     Lips: Pink.     Mouth: Mucous membranes are moist.     Pharynx: Oropharynx is clear. Uvula midline. No oropharyngeal exudate or posterior oropharyngeal erythema.  Eyes:     General: Lids are normal. Vision grossly intact. Gaze aligned appropriately. No scleral icterus.       Right eye: No discharge.        Left eye: No discharge.     Extraocular Movements: Extraocular movements intact.     Conjunctiva/sclera: Conjunctivae normal.     Pupils: Pupils are equal, round, and reactive to light.     Funduscopic exam:    Right eye: Red reflex present.        Left eye: Red reflex present. Neck:     Thyroid: No thyromegaly.     Vascular: No JVD.     Trachea: Trachea and phonation normal. No tracheal deviation.  Cardiovascular:     Rate and Rhythm: Normal rate and regular rhythm.     Pulses: Normal pulses.     Heart sounds: Normal heart sounds, S1 normal and S2 normal. No murmur heard.    No friction rub. No gallop.  Pulmonary:     Effort: Pulmonary effort is normal. No accessory muscle usage or respiratory distress.     Breath sounds: Normal breath sounds. No stridor. No  wheezing or rales.  Chest:     Chest wall: No tenderness.     Comments: Due for mammogram Abdominal:     General: Bowel sounds are normal. There is no distension.     Palpations: Abdomen is soft. There is no shifting dullness, fluid wave, mass or pulsatile mass.     Tenderness: There is no abdominal tenderness. There is no guarding or rebound.  Musculoskeletal:        General: No tenderness or deformity. Normal range of motion.     Cervical back: Normal range of motion and neck supple.     Right lower leg: No edema.     Left lower leg: No edema.  Lymphadenopathy:     Cervical: No cervical adenopathy.  Skin:    General: Skin is warm and dry.     Capillary Refill: Capillary refill takes less than 2 seconds.     Coloration: Skin is not pale.     Findings: No erythema or rash.  Neurological:     Mental Status: She is alert and oriented to person, place, and time.     Cranial Nerves: No cranial nerve deficit.     Motor: No abnormal muscle tone.     Coordination: Coordination normal.     Deep Tendon Reflexes: Reflexes are normal and symmetric.  Psychiatric:        Mood and Affect: Mood and affect normal.        Behavior: Behavior normal. Behavior is cooperative.        Thought Content: Thought content normal.        Judgment: Judgment normal.        Assessment/Plan: 1. Encounter for routine adult health examination with abnormal findings Age-appropriate  preventive screenings and vaccinations discussed, annual physical exam completed. Routine labs for health maintenance ordered, see below. PHM updated.  - CBC with Differential/Platelet - CMP14+EGFR - Lipid Profile - Vitamin D (25 hydroxy) - B12 and Folate Panel  2. Controlled type 2 diabetes mellitus with other circulatory complication, without long-term current use of insulin (HCC) Continue metformin as prescribed.  Routine labs ordered  - metFORMIN (GLUCOPHAGE) 500 MG tablet; TAKE 1 TABLET BY MOUTH EVERY DAY WITH BREAKFAST   Dispense: 90 tablet; Refill: 1 - CBC with Differential/Platelet - CMP14+EGFR - Lipid Profile - Vitamin D (25 hydroxy) - B12 and Folate Panel  3. Essential hypertension Continue lisinopril as prescribed  - lisinopril (ZESTRIL) 10 MG tablet; Take 1 tablet (10 mg total) by mouth daily.  Dispense: 90 tablet; Refill: 1  4. Mixed hyperlipidemia Routine labs ordered  - CMP14+EGFR - Lipid Profile  5. B12 deficiency Routine labs ordered  - CBC with Differential/Platelet - B12 and Folate Panel  6. Vitamin D deficiency Routine lab ordered - Vitamin D (25 hydroxy)  7. Dysuria Routine urinalysis done  - UA/M w/rflx Culture, Routine - Microscopic Examination - Urine Culture, Reflex      General Counseling: Adara verbalizes understanding of the findings of todays visit and agrees with plan of treatment. I have discussed any further diagnostic evaluation that may be needed or ordered today. We also reviewed her medications today. she has been encouraged to call the office with any questions or concerns that should arise related to todays visit.    Orders Placed This Encounter  Procedures   CBC with Differential/Platelet   CMP14+EGFR   Lipid Profile   Vitamin D (25 hydroxy)   B12 and Folate Panel    Meds ordered this encounter  Medications   lisinopril (ZESTRIL) 10 MG tablet    Sig: Take 1 tablet (10 mg total) by mouth daily.    Dispense:  90 tablet    Refill:  1    For future refills, add to her file.   metFORMIN (GLUCOPHAGE) 500 MG tablet    Sig: TAKE 1 TABLET BY MOUTH EVERY DAY WITH BREAKFAST    Dispense:  90 tablet    Refill:  1    For future refills, add to her file    Return in about 4 months (around 11/30/2022) for F/U, Recheck A1C, Marvis Saefong PCP.   Total time spent:30 Minutes Time spent includes review of chart, medications, test results, and follow up plan with the patient.   [place] Controlled Substance Database was reviewed by me.  This patient was seen by  Sallyanne Kuster, FNP-C in collaboration with Dr. Beverely Risen as a part of collaborative care agreement.  Tara Wich R. Tedd Sias, MSN, FNP-C Internal medicine

## 2022-08-04 LAB — UA/M W/RFLX CULTURE, ROUTINE
Bilirubin, UA: NEGATIVE
Glucose, UA: NEGATIVE
Ketones, UA: NEGATIVE
Nitrite, UA: NEGATIVE
Protein,UA: NEGATIVE
RBC, UA: NEGATIVE
Specific Gravity, UA: 1.007 (ref 1.005–1.030)
Urobilinogen, Ur: 0.2 mg/dL (ref 0.2–1.0)
pH, UA: 6.5 (ref 5.0–7.5)

## 2022-08-04 LAB — MICROSCOPIC EXAMINATION
Casts: NONE SEEN /lpf
Epithelial Cells (non renal): >10 /hpf — AB (ref 0–10)
RBC, Urine: NONE SEEN /hpf (ref 0–2)

## 2022-08-04 LAB — URINE CULTURE, REFLEX

## 2022-08-17 ENCOUNTER — Encounter: Payer: Self-pay | Admitting: Nurse Practitioner

## 2022-10-15 ENCOUNTER — Telehealth: Payer: Self-pay

## 2022-10-20 DIAGNOSIS — E538 Deficiency of other specified B group vitamins: Secondary | ICD-10-CM | POA: Diagnosis not present

## 2022-10-20 DIAGNOSIS — E559 Vitamin D deficiency, unspecified: Secondary | ICD-10-CM | POA: Diagnosis not present

## 2022-10-20 DIAGNOSIS — E1159 Type 2 diabetes mellitus with other circulatory complications: Secondary | ICD-10-CM | POA: Diagnosis not present

## 2022-10-20 DIAGNOSIS — Z0001 Encounter for general adult medical examination with abnormal findings: Secondary | ICD-10-CM | POA: Diagnosis not present

## 2022-10-20 DIAGNOSIS — E782 Mixed hyperlipidemia: Secondary | ICD-10-CM | POA: Diagnosis not present

## 2022-10-20 DIAGNOSIS — I10 Essential (primary) hypertension: Secondary | ICD-10-CM | POA: Diagnosis not present

## 2022-10-22 NOTE — Telephone Encounter (Signed)
Discuss at next visit 

## 2022-10-23 NOTE — Progress Notes (Signed)
Labs reviewed, no emergent or critical values. There are some abnormals and we will discuss those at the upcoming office visit in the beginning of September.

## 2022-11-09 DIAGNOSIS — L309 Dermatitis, unspecified: Secondary | ICD-10-CM | POA: Diagnosis not present

## 2022-11-11 DIAGNOSIS — R42 Dizziness and giddiness: Secondary | ICD-10-CM | POA: Diagnosis not present

## 2022-11-11 DIAGNOSIS — H6983 Other specified disorders of Eustachian tube, bilateral: Secondary | ICD-10-CM | POA: Diagnosis not present

## 2022-11-11 DIAGNOSIS — H6982 Other specified disorders of Eustachian tube, left ear: Secondary | ICD-10-CM | POA: Diagnosis not present

## 2022-11-11 DIAGNOSIS — H93293 Other abnormal auditory perceptions, bilateral: Secondary | ICD-10-CM | POA: Diagnosis not present

## 2022-11-24 DIAGNOSIS — H2513 Age-related nuclear cataract, bilateral: Secondary | ICD-10-CM | POA: Diagnosis not present

## 2022-11-24 DIAGNOSIS — E119 Type 2 diabetes mellitus without complications: Secondary | ICD-10-CM | POA: Diagnosis not present

## 2022-11-24 DIAGNOSIS — H524 Presbyopia: Secondary | ICD-10-CM | POA: Diagnosis not present

## 2022-11-24 DIAGNOSIS — H5203 Hypermetropia, bilateral: Secondary | ICD-10-CM | POA: Diagnosis not present

## 2022-12-03 ENCOUNTER — Ambulatory Visit (INDEPENDENT_AMBULATORY_CARE_PROVIDER_SITE_OTHER): Payer: Medicare HMO | Admitting: Nurse Practitioner

## 2022-12-03 ENCOUNTER — Encounter: Payer: Self-pay | Admitting: Nurse Practitioner

## 2022-12-03 VITALS — BP 130/74 | HR 95 | Temp 98.3°F | Resp 16 | Ht 64.5 in | Wt 149.0 lb

## 2022-12-03 DIAGNOSIS — E1159 Type 2 diabetes mellitus with other circulatory complications: Secondary | ICD-10-CM | POA: Diagnosis not present

## 2022-12-03 DIAGNOSIS — E1169 Type 2 diabetes mellitus with other specified complication: Secondary | ICD-10-CM

## 2022-12-03 DIAGNOSIS — E785 Hyperlipidemia, unspecified: Secondary | ICD-10-CM

## 2022-12-03 DIAGNOSIS — E559 Vitamin D deficiency, unspecified: Secondary | ICD-10-CM | POA: Diagnosis not present

## 2022-12-03 LAB — POCT GLYCOSYLATED HEMOGLOBIN (HGB A1C): Hemoglobin A1C: 6.3 % — AB (ref 4.0–5.6)

## 2022-12-03 MED ORDER — ERGOCALCIFEROL 1.25 MG (50000 UT) PO CAPS: 50000.0000 [IU] | ORAL_CAPSULE | ORAL | 2 refills | Status: DC

## 2022-12-03 NOTE — Progress Notes (Signed)
[facility] [address] [place], [place] [address]  Internal MEDICINE  Office Visit Note  Patient Name: Adrienne Brown  [id number]  [id number]  Date of Service: 12/03/2022  Chief Complaint  Patient presents with   Diabetes   Hypertension   Follow-up    HPI Allesandra presents for a follow-up visit for diabetes, high cholesterol and low vitamin D Diabetes -- A1c still improving by 0.1 to 6.3 today.  High cholesterol -- greatly improved with diet and metformin no cholesterol medications.  Vitamin D deficiency -- low normal range, continue weekly supplement    Current Medication: Outpatient Encounter Medications as of 12/03/2022  Medication Sig   glucose blood (PRECISION QID TEST) test strip Check sugar once daily   Lancets MISC Check sugar once daily   lisinopril (ZESTRIL) 10 MG tablet Take 1 tablet (10 mg total) by mouth daily.   metFORMIN (GLUCOPHAGE) 500 MG tablet TAKE 1 TABLET BY MOUTH EVERY DAY WITH BREAKFAST   [DISCONTINUED] ergocalciferol (VITAMIN D2) 1.25 MG (50000 UT) capsule Take 1 capsule (50,000 Units total) by mouth once a week.   ergocalciferol (VITAMIN D2) 1.25 MG (50000 UT) capsule Take 1 capsule (50,000 Units total) by mouth once a week.   No facility-administered encounter medications on file as of 12/03/2022.    Surgical History: Past Surgical History:  Procedure Laterality Date   COLONOSCOPY WITH PROPOFOL N/A 09/12/2021   Procedure: COLONOSCOPY WITH PROPOFOL;  Surgeon: Wyline Mood, MD;  Location: [facility] ENDOSCOPY;  Service: Gastroenterology;  Laterality: N/A;   GALLBLADDER SURGERY  2013    Medical History: Past Medical History:  Diagnosis Date   Hypertension    Type II diabetes mellitus (HCC)     Family History: Family History  Problem Relation Age of Onset   Hyperthyroidism Mother        47 yrs old, smokes cigs and alcohol   Hypertension Mother    Diabetes Mother    Diabetes Father    Intestinal malrotation Father         intestinal blockage   Dementia Father     Social History   Socioeconomic History   Marital status: Unknown    Spouse name: Not on file   Number of children: Not on file   Years of education: Not on file   Highest education level: Not on file  Occupational History   Not on file  Tobacco Use   Smoking status: Former    Current packs/day: 0.00    Types: Cigarettes    Quit date: 03/23/2004    Years since quitting: 18.7   Smokeless tobacco: Former   Tobacco comments:    does nicorett at times, quit 2006  Vaping Use   Vaping status: Never Used  Substance and Sexual Activity   Alcohol use: Not Currently    Alcohol/week: 2.0 standard drinks of alcohol    Types: 2 Glasses of wine per week    Comment: occ   Drug use: Never   Sexual activity: Not on file  Other Topics Concern   Not on file  Social History Narrative   Not on file   Social Determinants of Health   Financial Resource Strain: Not on file  Food Insecurity: Not on file  Transportation Needs: Not on file  Physical Activity: Not on file  Stress: Not on file  Social Connections: Not on file  Intimate Partner Violence: Not on file      Review of Systems  Constitutional:  Negative for chills, fatigue and  unexpected weight change.  HENT:  Negative for congestion, rhinorrhea, sneezing and sore throat.   Eyes:  Negative for redness.  Respiratory:  Negative for cough, chest tightness and shortness of breath.   Cardiovascular:  Negative for chest pain and palpitations.  Gastrointestinal:  Negative for abdominal pain, constipation, diarrhea, nausea and vomiting.  Genitourinary:  Negative for dysuria and frequency.  Musculoskeletal:  Negative for arthralgias, back pain, joint swelling and neck pain.  Skin:  Negative for rash.  Neurological: Negative.  Negative for tremors and numbness.  Hematological:  Negative for adenopathy. Does not bruise/bleed easily.  Psychiatric/Behavioral:  Negative for behavioral problems  (Depression), sleep disturbance and suicidal ideas. The patient is not nervous/anxious.     Vital Signs: BP 130/74 Comment: 150/78  Pulse 95   Temp 98.3 F (36.8 C)   Resp 16   Ht 5' 4.5" (1.638 m)   Wt 149 lb (67.6 kg)   SpO2 98%   BMI 25.18 kg/m    Physical Exam Vitals reviewed.  Constitutional:      General: She is not in acute distress.    Appearance: Normal appearance. She is not ill-appearing.  HENT:     Head: Normocephalic and atraumatic.  Eyes:     Pupils: Pupils are equal, round, and reactive to light.  Cardiovascular:     Rate and Rhythm: Normal rate and regular rhythm.  Pulmonary:     Effort: Pulmonary effort is normal. No respiratory distress.  Neurological:     Mental Status: She is alert and oriented to person, place, and time.  Psychiatric:        Mood and Affect: Mood normal.        Behavior: Behavior normal.        Assessment/Plan: 1. Type 2 diabetes mellitus with other specified complication, without long-term current use of insulin (HCC) Stable, A1c continues to improve - POCT glycosylated hemoglobin (Hb A1C)  2. Hyperlipidemia associated with type 2 diabetes mellitus (HCC) Managing with diet, continue as discussed.   3. Vitamin D deficiency Continue weekly vitamin D supplement as prescribed.  - ergocalciferol (VITAMIN D2) 1.25 MG (50000 UT) capsule; Take 1 capsule (50,000 Units total) by mouth once a week.  Dispense: 4 capsule; Refill: 2   General Counseling: Teddie verbalizes understanding of the findings of todays visit and agrees with plan of treatment. I have discussed any further diagnostic evaluation that may be needed or ordered today. We also reviewed her medications today. she has been encouraged to call the office with any questions or concerns that should arise related to todays visit.    Orders Placed This Encounter  Procedures   POCT glycosylated hemoglobin (Hb A1C)    Meds ordered this encounter  Medications    ergocalciferol (VITAMIN D2) 1.25 MG (50000 UT) capsule    Sig: Take 1 capsule (50,000 Units total) by mouth once a week.    Dispense:  4 capsule    Refill:  2    Return in about 5 months (around 04/26/2023) for F/U, Recheck A1C, Jennilyn Esteve PCP.   Total time spent:30 Minutes Time spent includes review of chart, medications, test results, and follow up plan with the patient.   [place] Controlled Substance Database was reviewed by me.  This patient was seen by Sallyanne Kuster, FNP-C in collaboration with Dr. Beverely Risen as a part of collaborative care agreement.   Rut Betterton R. Tedd Sias, MSN, FNP-C Internal medicine

## 2022-12-11 ENCOUNTER — Telehealth: Payer: Self-pay

## 2022-12-11 NOTE — Telephone Encounter (Signed)
Pt went to urgent care Saturday for bug bite on right leg it was swelling  and asper her swelling is down but pain in her right calf as per lauren advised her that she can go to urgent care if she having terrible pain

## 2023-01-08 ENCOUNTER — Other Ambulatory Visit: Payer: Self-pay | Admitting: Nurse Practitioner

## 2023-01-08 DIAGNOSIS — E1159 Type 2 diabetes mellitus with other circulatory complications: Secondary | ICD-10-CM

## 2023-01-20 ENCOUNTER — Other Ambulatory Visit: Payer: Self-pay

## 2023-01-20 ENCOUNTER — Telehealth: Payer: Self-pay

## 2023-01-20 DIAGNOSIS — E1159 Type 2 diabetes mellitus with other circulatory complications: Secondary | ICD-10-CM

## 2023-01-20 NOTE — Telephone Encounter (Signed)
Lmom to pt callus she need Urine microalbumin test for gaps and we can make her nurse visit or we can lab order labs to be done

## 2023-01-21 DIAGNOSIS — E1159 Type 2 diabetes mellitus with other circulatory complications: Secondary | ICD-10-CM | POA: Diagnosis not present

## 2023-01-22 LAB — MICROALBUMIN / CREATININE URINE RATIO
Creatinine, Urine: 17.6 mg/dL
Microalb/Creat Ratio: 31 mg/g{creat} — ABNORMAL HIGH (ref 0–29)
Microalbumin, Urine: 5.4 ug/mL

## 2023-01-29 ENCOUNTER — Encounter: Payer: Self-pay | Admitting: Nurse Practitioner

## 2023-04-14 ENCOUNTER — Other Ambulatory Visit: Payer: Self-pay | Admitting: Nurse Practitioner

## 2023-04-14 DIAGNOSIS — I10 Essential (primary) hypertension: Secondary | ICD-10-CM

## 2023-05-05 ENCOUNTER — Ambulatory Visit: Payer: Medicare HMO | Admitting: Nurse Practitioner

## 2023-05-12 ENCOUNTER — Encounter: Payer: Self-pay | Admitting: Nurse Practitioner

## 2023-05-12 ENCOUNTER — Ambulatory Visit (INDEPENDENT_AMBULATORY_CARE_PROVIDER_SITE_OTHER): Payer: Medicare HMO | Admitting: Nurse Practitioner

## 2023-05-12 VITALS — BP 136/80 | HR 87 | Temp 98.7°F | Resp 16 | Ht 64.5 in | Wt 152.8 lb

## 2023-05-12 DIAGNOSIS — I152 Hypertension secondary to endocrine disorders: Secondary | ICD-10-CM | POA: Diagnosis not present

## 2023-05-12 DIAGNOSIS — Z1231 Encounter for screening mammogram for malignant neoplasm of breast: Secondary | ICD-10-CM | POA: Diagnosis not present

## 2023-05-12 DIAGNOSIS — Z23 Encounter for immunization: Secondary | ICD-10-CM | POA: Diagnosis not present

## 2023-05-12 DIAGNOSIS — E1159 Type 2 diabetes mellitus with other circulatory complications: Secondary | ICD-10-CM

## 2023-05-12 DIAGNOSIS — E1169 Type 2 diabetes mellitus with other specified complication: Secondary | ICD-10-CM | POA: Diagnosis not present

## 2023-05-12 LAB — POCT GLYCOSYLATED HEMOGLOBIN (HGB A1C): Hemoglobin A1C: 6.5 % — AB (ref 4.0–5.6)

## 2023-05-12 MED ORDER — PNEUMOCOCCAL 20-VAL CONJ VACC 0.5 ML IM SUSY: 0.5000 mL | PREFILLED_SYRINGE | Freq: Once | INTRAMUSCULAR | 0 refills | Status: DC | PRN

## 2023-05-12 MED ORDER — METFORMIN HCL 500 MG PO TABS: ORAL_TABLET | ORAL | 1 refills | Status: DC

## 2023-05-12 MED ORDER — ZOSTER VAC RECOMB ADJUVANTED 50 MCG/0.5ML IM SUSR: 0.5000 mL | Freq: Once | INTRAMUSCULAR | 2 refills | Status: DC | PRN

## 2023-05-12 NOTE — Progress Notes (Signed)
[facility] [address] [place], [place] [address]  Internal MEDICINE  Office Visit Note  Patient Name: Adrienne Brown  [id number]  [id number]  Date of Service: 05/12/2023  Chief Complaint  Patient presents with   Hypertension   Diabetes   Follow-up   Quality Metric Gaps    Mammogram, Eye Exam, Pneumonia and Shingles vaccines    HPI Cornesha presents for a follow-up visit for diabetes, hypertension and screenings.  Diabetes -- A1c elevated at 6.5 but still stable. Takes metformin.  Hypertension -- elevated BP today, rechecked. Takes lisinopril 10 mg daily.  Due for mammogram  Eye exam -- last eye exam was march 2024, due next month  Pneumonia and shingles vaccines -- due for both and is wanting to get them  Colonoscopy due in 2026    Current Medication: Outpatient Encounter Medications as of 05/12/2023  Medication Sig   ergocalciferol (VITAMIN D2) 1.25 MG (50000 UT) capsule Take 1 capsule (50,000 Units total) by mouth once a week.   glucose blood (PRECISION QID TEST) test strip Check sugar once daily   Lancets MISC Check sugar once daily   lisinopril (ZESTRIL) 10 MG tablet TAKE 1 TABLET BY MOUTH EVERY DAY   pneumococcal 20-valent conjugate vaccine (PREVNAR 20) 0.5 ML injection Inject 0.5 mLs into the muscle once as needed for up to 1 dose for immunization.   Zoster Vaccine Adjuvanted [facility]) injection Inject 0.5 mLs into the muscle once as needed for up to 1 dose (shingles vaccination).   [DISCONTINUED] metFORMIN (GLUCOPHAGE) 500 MG tablet TAKE 1 TABLET BY MOUTH EVERY DAY WITH BREAKFAST   metFORMIN (GLUCOPHAGE) 500 MG tablet TAKE 1 TABLET BY MOUTH EVERY DAY WITH BREAKFAST   No facility-administered encounter medications on file as of 05/12/2023.    Surgical History: Past Surgical History:  Procedure Laterality Date   COLONOSCOPY WITH PROPOFOL N/A 09/12/2021   Procedure: COLONOSCOPY WITH PROPOFOL;  Surgeon: Wyline Mood, MD;  Location: [facility] ENDOSCOPY;   Service: Gastroenterology;  Laterality: N/A;   GALLBLADDER SURGERY  2013    Medical History: Past Medical History:  Diagnosis Date   Hypertension    Type II diabetes mellitus (HCC)     Family History: Family History  Problem Relation Age of Onset   Hyperthyroidism Mother        11 yrs old, smokes cigs and alcohol   Hypertension Mother    Diabetes Mother    Diabetes Father    Intestinal malrotation Father        intestinal blockage   Dementia Father     Social History   Socioeconomic History   Marital status: Unknown    Spouse name: Not on file   Number of children: Not on file   Years of education: Not on file   Highest education level: Not on file  Occupational History   Not on file  Tobacco Use   Smoking status: Former    Current packs/day: 0.00    Types: Cigarettes    Quit date: 03/23/2004    Years since quitting: 19.1   Smokeless tobacco: Former   Tobacco comments:    does nicorett at times, quit 2006  Vaping Use   Vaping status: Never Used  Substance and Sexual Activity   Alcohol use: Not Currently    Alcohol/week: 2.0 standard drinks of alcohol    Types: 2 Glasses of wine per week    Comment: occ   Drug use: Never   Sexual activity: Not on file  Other  Topics Concern   Not on file  Social History Narrative   Not on file   Social Drivers of Health   Financial Resource Strain: Not on file  Food Insecurity: Not on file  Transportation Needs: Not on file  Physical Activity: Not on file  Stress: Not on file  Social Connections: Not on file  Intimate Partner Violence: Not on file      Review of Systems  Constitutional:  Negative for chills, fatigue and unexpected weight change.  HENT:  Negative for congestion, rhinorrhea, sneezing and sore throat.   Eyes:  Negative for redness.  Respiratory:  Negative for cough, chest tightness and shortness of breath.   Cardiovascular:  Negative for chest pain and palpitations.  Gastrointestinal:  Negative for  abdominal pain, constipation, diarrhea, nausea and vomiting.  Genitourinary:  Negative for dysuria and frequency.  Musculoskeletal:  Negative for arthralgias, back pain, joint swelling and neck pain.  Skin:  Negative for rash.  Neurological: Negative.  Negative for tremors and numbness.  Hematological:  Negative for adenopathy. Does not bruise/bleed easily.  Psychiatric/Behavioral:  Negative for behavioral problems (Depression), sleep disturbance and suicidal ideas. The patient is not nervous/anxious.     Vital Signs: BP 136/80   Pulse 87   Temp 98.7 F (37.1 C)   Resp 16   Ht 5' 4.5" (1.638 m)   Wt 152 lb 12.8 oz (69.3 kg)   SpO2 92%   BMI 25.82 kg/m    Physical Exam Vitals reviewed.  Constitutional:      General: She is not in acute distress.    Appearance: Normal appearance. She is not ill-appearing.  HENT:     Head: Normocephalic and atraumatic.  Eyes:     Pupils: Pupils are equal, round, and reactive to light.  Cardiovascular:     Rate and Rhythm: Normal rate and regular rhythm.  Pulmonary:     Effort: Pulmonary effort is normal. No respiratory distress.  Neurological:     Mental Status: She is alert and oriented to person, place, and time.  Psychiatric:        Mood and Affect: Mood normal.        Behavior: Behavior normal.        Assessment/Plan: 1. Type 2 diabetes mellitus with other specified complication, without long-term current use of insulin (HCC) (Primary) A1c slightly elevated but remains stable. Continue metformin as prescribed. Urine sent for microalbumin/creatinine ratio - POCT glycosylated hemoglobin (Hb A1C) - metFORMIN (GLUCOPHAGE) 500 MG tablet; TAKE 1 TABLET BY MOUTH EVERY DAY WITH BREAKFAST  Dispense: 90 tablet; Refill: 1 - Urine Microalbumin w/creat. ratio  2. Hypertension associated with type 2 diabetes mellitus (HCC) Stable, Continue lisinopril as prescribed.   3. Need for vaccination - pneumococcal 20-valent conjugate vaccine  (PREVNAR 20) 0.5 ML injection; Inject 0.5 mLs into the muscle once as needed for up to 1 dose for immunization.  Dispense: 0.5 mL; Refill: 0 - Zoster Vaccine Adjuvanted [facility]) injection; Inject 0.5 mLs into the muscle once as needed for up to 1 dose (shingles vaccination).  Dispense: 0.5 mL; Refill: 2  4. Encounter for screening mammogram for malignant neoplasm of breast Overdue for mammogram, wants to go to [facility] breast care center [facility] but wants to check with her insurance first to make sure they are in-network. She will call the clinic after she confirms this.   General Counseling: Cicilia verbalizes understanding of the findings of todays visit and agrees with plan of treatment. I have discussed any  further diagnostic evaluation that may be needed or ordered today. We also reviewed her medications today. she has been encouraged to call the office with any questions or concerns that should arise related to todays visit.    Orders Placed This Encounter  Procedures   Urine Microalbumin w/creat. ratio   POCT glycosylated hemoglobin (Hb A1C)    Meds ordered this encounter  Medications   metFORMIN (GLUCOPHAGE) 500 MG tablet    Sig: TAKE 1 TABLET BY MOUTH EVERY DAY WITH BREAKFAST    Dispense:  90 tablet    Refill:  1    For future refills   pneumococcal 20-valent conjugate vaccine (PREVNAR 20) 0.5 ML injection    Sig: Inject 0.5 mLs into the muscle once as needed for up to 1 dose for immunization.    Dispense:  0.5 mL    Refill:  0   Zoster Vaccine Adjuvanted [facility]) injection    Sig: Inject 0.5 mLs into the muscle once as needed for up to 1 dose (shingles vaccination).    Dispense:  0.5 mL    Refill:  2    Due for 2 dose shingles vaccine series    Return for previously scheduled, AWV, Jamira Barfuss PCP in may this year .   Total time spent:30 Minutes Time spent includes review of chart, medications, test results, and follow up plan with the patient.   [place] Controlled  Substance Database was reviewed by me.  This patient was seen by Sallyanne Kuster, FNP-C in collaboration with Dr. Beverely Risen as a part of collaborative care agreement.   Alayia Meggison R. Tedd Sias, MSN, FNP-C Internal medicine

## 2023-05-14 LAB — MICROALBUMIN / CREATININE URINE RATIO
Creatinine, Urine: 18.5 mg/dL
Microalbumin, Urine: <3 ug/mL

## 2023-07-14 ENCOUNTER — Other Ambulatory Visit: Payer: Self-pay | Admitting: Nurse Practitioner

## 2023-07-14 DIAGNOSIS — Z1231 Encounter for screening mammogram for malignant neoplasm of breast: Secondary | ICD-10-CM

## 2023-07-22 ENCOUNTER — Ambulatory Visit
Admission: RE | Admit: 2023-07-22 | Discharge: 2023-07-22 | Disposition: A | Discharge status: Home / Self Care | Source: Ambulatory Visit | Attending: Nurse Practitioner | Admitting: Nurse Practitioner

## 2023-07-22 DIAGNOSIS — Z1231 Encounter for screening mammogram for malignant neoplasm of breast: Secondary | ICD-10-CM | POA: Insufficient documentation

## 2023-08-02 ENCOUNTER — Ambulatory Visit: Payer: Medicare HMO | Admitting: Nurse Practitioner

## 2023-08-17 ENCOUNTER — Ambulatory Visit: Payer: Medicare HMO | Admitting: Nurse Practitioner

## 2023-08-24 ENCOUNTER — Encounter: Payer: Self-pay | Admitting: Nurse Practitioner

## 2023-08-24 ENCOUNTER — Ambulatory Visit (INDEPENDENT_AMBULATORY_CARE_PROVIDER_SITE_OTHER): Admitting: Nurse Practitioner

## 2023-08-24 VITALS — BP 138/80 | HR 88 | Temp 98.3°F | Resp 16 | Ht 64.5 in | Wt 152.0 lb

## 2023-08-24 DIAGNOSIS — Z0000 Encounter for general adult medical examination without abnormal findings: Secondary | ICD-10-CM

## 2023-08-24 DIAGNOSIS — E1169 Type 2 diabetes mellitus with other specified complication: Secondary | ICD-10-CM

## 2023-08-24 DIAGNOSIS — E1159 Type 2 diabetes mellitus with other circulatory complications: Secondary | ICD-10-CM | POA: Diagnosis not present

## 2023-08-24 DIAGNOSIS — E785 Hyperlipidemia, unspecified: Secondary | ICD-10-CM | POA: Diagnosis not present

## 2023-08-24 DIAGNOSIS — I152 Hypertension secondary to endocrine disorders: Secondary | ICD-10-CM

## 2023-08-24 DIAGNOSIS — E559 Vitamin D deficiency, unspecified: Secondary | ICD-10-CM | POA: Diagnosis not present

## 2023-08-24 DIAGNOSIS — I10 Essential (primary) hypertension: Secondary | ICD-10-CM

## 2023-08-24 LAB — POCT GLYCOSYLATED HEMOGLOBIN (HGB A1C): Hemoglobin A1C: 6.3 % — AB (ref 4.0–5.6)

## 2023-08-24 MED ORDER — METFORMIN HCL 500 MG PO TABS
ORAL_TABLET | ORAL | 1 refills | Status: AC
Start: 2023-08-24 — End: ?

## 2023-08-24 MED ORDER — LISINOPRIL 10 MG PO TABS: 10.0000 mg | ORAL_TABLET | Freq: Every day | ORAL | 1 refills | Status: DC

## 2023-08-24 NOTE — Progress Notes (Signed)
 [facility] [address] [place], [place] [address]  Internal MEDICINE  Office Visit Note  Patient Name: Adrienne Brown  [id number]  [id number]  Date of Service: 08/24/2023  Chief Complaint  Patient presents with   Diabetes   Hypertension   Medicare Wellness   Quality Metric Gaps    Pneumonia and Shingles vaccines    HPI Adrienne Brown presents for an annual well visit and physical exam.  Well-appearing 68 y.o. female with diabetes, hypertension, high cholesterol, and vitamin D  deficiency.  Routine CRC screening: done in 2023 Routine mammogram: done last month  DEXA scan: done in 2022 Eye exam due in September.   foot exam: done  Labs: due for routine labs  New or worsening pain: none  Other concerns: none      08/24/2023    2:14 PM 07/30/2022   11:13 AM 07/28/2021   11:28 AM  MMSE - Mini Mental State Exam  Orientation to time 5 5 5   Orientation to Place 5 5 5   Registration 3 3 3   Attention/ Calculation 5 5 5   Recall 3 3 3   Language- name 2 objects 2 2 2   Language- repeat 1 1 1   Language- follow 3 step command 3 3 3   Language- read & follow direction 1 1 1   Write a sentence 1 1 1   Copy design 1 1 1   Total score 30 30 30     Functional Status Survey: Is the patient deaf or have difficulty hearing?: No Does the patient have difficulty seeing, even when wearing glasses/contacts?: No Does the patient have difficulty concentrating, remembering, or making decisions?: No Does the patient have difficulty walking or climbing stairs?: No Does the patient have difficulty dressing or bathing?: No Does the patient have difficulty doing errands alone such as visiting a doctor's office or shopping?: No     02/04/2022    1:54 PM 05/27/2022    2:24 PM 07/30/2022   11:12 AM 05/12/2023   10:14 AM 08/24/2023    2:13 PM  Fall Risk  Falls in the past year? 0 0 0 0 0  Was there an injury with Fall? 0 0 0    Fall Risk Category Calculator 0 0 0    Fall Risk Category (Retired)  Low       (RETIRED) Patient Fall Risk Level Low fall risk       Patient at Risk for Falls Due to No Fall Risks No Fall Risks No Fall Risks    Fall risk Follow up Falls evaluation completed  Falls evaluation completed Falls evaluation completed       Data saved with a previous flowsheet row definition       08/24/2023    2:13 PM  Depression screen PHQ 2/9  Decreased Interest 0  Down, Depressed, Hopeless 0  PHQ - 2 Score 0       Current Medication: Outpatient Encounter Medications as of 08/24/2023  Medication Sig   ergocalciferol  (VITAMIN D2) 1.25 MG (50000 UT) capsule Take 1 capsule (50,000 Units total) by mouth once a week.   glucose blood (PRECISION QID TEST) test strip Check sugar once daily   Lancets MISC Check sugar once daily   pneumococcal 20-valent conjugate vaccine (PREVNAR 20) 0.5 ML injection Inject 0.5 mLs into the muscle once as needed for up to 1 dose for immunization.   Zoster Vaccine Adjuvanted [facility]) injection Inject 0.5 mLs into the muscle once as needed for up to 1 dose (shingles vaccination).   [  DISCONTINUED] lisinopril  (ZESTRIL ) 10 MG tablet TAKE 1 TABLET BY MOUTH EVERY DAY   [DISCONTINUED] metFORMIN  (GLUCOPHAGE ) 500 MG tablet TAKE 1 TABLET BY MOUTH EVERY DAY WITH BREAKFAST   lisinopril  (ZESTRIL ) 10 MG tablet Take 1 tablet (10 mg total) by mouth daily.   metFORMIN  (GLUCOPHAGE ) 500 MG tablet TAKE 1 TABLET BY MOUTH EVERY DAY WITH BREAKFAST   No facility-administered encounter medications on file as of 08/24/2023.    Surgical History: Past Surgical History:  Procedure Laterality Date   COLONOSCOPY WITH PROPOFOL  N/A 09/12/2021   Procedure: COLONOSCOPY WITH PROPOFOL ;  Surgeon: Therisa Bi, MD;  Location: [facility] ENDOSCOPY;  Service: Gastroenterology;  Laterality: N/A;   GALLBLADDER SURGERY  2013    Medical History: Past Medical History:  Diagnosis Date   Hypertension    Type II diabetes mellitus (HCC)     Family History: Family History  Problem Relation  Age of Onset   Hyperthyroidism Mother        16 yrs old, smokes cigs and alcohol   Hypertension Mother    Diabetes Mother    Diabetes Father    Intestinal malrotation Father        intestinal blockage   Dementia Father     Social History   Socioeconomic History   Marital status: Unknown    Spouse name: Not on file   Number of children: Not on file   Years of education: Not on file   Highest education level: Not on file  Occupational History   Not on file  Tobacco Use   Smoking status: Former    Current packs/day: 0.00    Types: Cigarettes    Quit date: 03/23/2004    Years since quitting: 19.5   Smokeless tobacco: Former   Tobacco comments:    does nicorett at times, quit 2006  Vaping Use   Vaping status: Never Used  Substance and Sexual Activity   Alcohol use: Not Currently    Alcohol/week: 2.0 standard drinks of alcohol    Types: 2 Glasses of wine per week    Comment: occ   Drug use: Never   Sexual activity: Not on file  Other Topics Concern   Not on file  Social History Narrative   Not on file   Social Drivers of Health   Financial Resource Strain: Not on file  Food Insecurity: Not on file  Transportation Needs: Not on file  Physical Activity: Not on file  Stress: Not on file  Social Connections: Not on file  Intimate Partner Violence: Not on file      Review of Systems  Constitutional:  Negative for activity change, appetite change, chills, fatigue, fever and unexpected weight change.  HENT: Negative.  Negative for congestion, ear pain, rhinorrhea, sore throat and trouble swallowing.   Eyes: Negative.   Respiratory: Negative.  Negative for cough, chest tightness, shortness of breath and wheezing.   Cardiovascular: Negative.  Negative for chest pain.  Gastrointestinal: Negative.  Negative for abdominal pain, blood in stool, constipation, diarrhea, nausea and vomiting.  Endocrine: Negative.   Genitourinary: Negative.  Negative for difficulty urinating,  dysuria, frequency, hematuria and urgency.  Musculoskeletal: Negative.  Negative for arthralgias, back pain, joint swelling, myalgias and neck pain.  Skin: Negative.  Negative for rash and wound.  Allergic/Immunologic: Negative.  Negative for immunocompromised state.  Neurological: Negative.  Negative for dizziness, seizures, numbness and headaches.  Hematological: Negative.   Psychiatric/Behavioral: Negative.  Negative for behavioral problems, self-injury and suicidal ideas. The patient is not  nervous/anxious.     Vital Signs: BP 138/80   Pulse 88   Temp 98.3 F (36.8 C)   Resp 16   Ht 5' 4.5 (1.638 m)   Wt 152 lb (68.9 kg)   SpO2 99%   BMI 25.69 kg/m    Physical Exam Vitals reviewed.  Constitutional:      General: She is awake. She is not in acute distress.    Appearance: Normal appearance. She is well-developed, well-groomed and normal weight. She is not ill-appearing or diaphoretic.  HENT:     Head: Normocephalic and atraumatic.     Right Ear: Tympanic membrane, ear canal and external ear normal.     Left Ear: Tympanic membrane, ear canal and external ear normal.     Nose: Nose normal.     Mouth/Throat:     Lips: Pink.     Mouth: Mucous membranes are moist.     Pharynx: Oropharynx is clear. Uvula midline. No oropharyngeal exudate or posterior oropharyngeal erythema.  Eyes:     General: Lids are normal. Vision grossly intact. Gaze aligned appropriately. No scleral icterus.       Right eye: No discharge.        Left eye: No discharge.     Extraocular Movements: Extraocular movements intact.     Conjunctiva/sclera: Conjunctivae normal.     Pupils: Pupils are equal, round, and reactive to light.     Funduscopic exam:    Right eye: Red reflex present.        Left eye: Red reflex present. Neck:     Thyroid : No thyromegaly.     Vascular: No JVD.     Trachea: Trachea and phonation normal. No tracheal deviation.  Cardiovascular:     Rate and Rhythm: Normal rate and  regular rhythm.     Pulses: Normal pulses.     Heart sounds: Normal heart sounds, S1 normal and S2 normal. No murmur heard.    No friction rub. No gallop.  Pulmonary:     Effort: Pulmonary effort is normal. No accessory muscle usage or respiratory distress.     Breath sounds: Normal breath sounds. No stridor. No wheezing or rales.  Chest:     Chest wall: No tenderness.     Comments: Due for mammogram Abdominal:     General: Bowel sounds are normal. There is no distension.     Palpations: Abdomen is soft. There is no shifting dullness, fluid wave, mass or pulsatile mass.     Tenderness: There is no abdominal tenderness. There is no guarding or rebound.  Musculoskeletal:        General: No tenderness or deformity. Normal range of motion.     Cervical back: Normal range of motion and neck supple.     Right lower leg: No edema.     Left lower leg: No edema.  Lymphadenopathy:     Cervical: No cervical adenopathy.  Skin:    General: Skin is warm and dry.     Capillary Refill: Capillary refill takes less than 2 seconds.     Coloration: Skin is not pale.     Findings: No erythema or rash.  Neurological:     Mental Status: She is alert and oriented to person, place, and time.     Cranial Nerves: No cranial nerve deficit.     Motor: No abnormal muscle tone.     Coordination: Coordination normal.     Deep Tendon Reflexes: Reflexes are normal and symmetric.  Psychiatric:  Mood and Affect: Mood and affect normal.        Behavior: Behavior normal. Behavior is cooperative.        Thought Content: Thought content normal.        Judgment: Judgment normal.        Assessment/Plan: 1. Encounter for subsequent annual wellness visit (AWV) in Medicare patient (Primary) Age-appropriate preventive screenings and vaccinations discussed. Routine labs for health maintenance will be ordered soon. PHM updated.    2. Type 2 diabetes mellitus with other specified complication, without long-term  current use of insulin (HCC) A1c is stable, continue metformin  as prescribed.  - POCT glycosylated hemoglobin (Hb A1C) - metFORMIN  (GLUCOPHAGE ) 500 MG tablet; TAKE 1 TABLET BY MOUTH EVERY DAY WITH BREAKFAST  Dispense: 90 tablet; Refill: 1  3. Hypertension associated with diabetes (HCC) Stable, continue lisinopril  as prescribed.  - lisinopril  (ZESTRIL ) 10 MG tablet; Take 1 tablet (10 mg total) by mouth daily.  Dispense: 90 tablet; Refill: 1  4. Hyperlipidemia associated with type 2 diabetes mellitus (HCC) Will repeat cholesterol panel with routine labs, not currently on statin therapy.   5. Vitamin D  deficiency Will repeat labs for this year soon     General Counseling: Adrienne Brown verbalizes understanding of the findings of todays visit and agrees with plan of treatment. I have discussed any further diagnostic evaluation that may be needed or ordered today. We also reviewed her medications today. she has been encouraged to call the office with any questions or concerns that should arise related to todays visit.    Orders Placed This Encounter  Procedures   POCT glycosylated hemoglobin (Hb A1C)    Meds ordered this encounter  Medications   lisinopril  (ZESTRIL ) 10 MG tablet    Sig: Take 1 tablet (10 mg total) by mouth daily.    Dispense:  90 tablet    Refill:  1    For future refills   metFORMIN  (GLUCOPHAGE ) 500 MG tablet    Sig: TAKE 1 TABLET BY MOUTH EVERY DAY WITH BREAKFAST    Dispense:  90 tablet    Refill:  1    For future refills    Return in about 3 months (around 11/24/2023) for F/U, Labs, Jaques Mineer PCP, have labs done before next visit. .   Total time spent:30 Minutes Time spent includes review of chart, medications, test results, and follow up plan with the patient.   [place] Controlled Substance Database was reviewed by me.  This patient was seen by Mardy Maxin, FNP-C in collaboration with Dr. Sigrid Bathe as a part of collaborative care agreement.  Lebron Nauert R.  Maxin, MSN, FNP-C Internal medicine

## 2023-09-26 ENCOUNTER — Encounter: Payer: Self-pay | Admitting: Nurse Practitioner

## 2023-09-26 DIAGNOSIS — E1169 Type 2 diabetes mellitus with other specified complication: Secondary | ICD-10-CM | POA: Insufficient documentation

## 2023-09-26 DIAGNOSIS — I152 Hypertension secondary to endocrine disorders: Secondary | ICD-10-CM | POA: Insufficient documentation

## 2023-09-26 DIAGNOSIS — E559 Vitamin D deficiency, unspecified: Secondary | ICD-10-CM | POA: Insufficient documentation

## 2023-09-26 DIAGNOSIS — E119 Type 2 diabetes mellitus without complications: Secondary | ICD-10-CM | POA: Insufficient documentation

## 2023-09-26 DIAGNOSIS — I10 Essential (primary) hypertension: Secondary | ICD-10-CM | POA: Insufficient documentation

## 2023-10-25 DIAGNOSIS — H5203 Hypermetropia, bilateral: Secondary | ICD-10-CM | POA: Diagnosis not present

## 2023-10-25 DIAGNOSIS — H2513 Age-related nuclear cataract, bilateral: Secondary | ICD-10-CM | POA: Diagnosis not present

## 2023-10-25 DIAGNOSIS — Z0100 Encounter for examination of eyes and vision without abnormal findings: Secondary | ICD-10-CM | POA: Diagnosis not present

## 2023-10-25 DIAGNOSIS — H524 Presbyopia: Secondary | ICD-10-CM | POA: Diagnosis not present

## 2023-10-25 DIAGNOSIS — E119 Type 2 diabetes mellitus without complications: Secondary | ICD-10-CM | POA: Diagnosis not present

## 2023-10-25 LAB — HM DIABETES EYE EXAM

## 2023-11-19 ENCOUNTER — Telehealth: Payer: Self-pay

## 2023-11-19 NOTE — Telephone Encounter (Signed)
 Patient called stating she picked up her Metformin  from the pharmacy and they told her its from a new company but still the same medicine. Well she has been having very bad headache, blood pressure been high and having some dizzy and nauseas, Spoke with Alyssa and she said to have her cut the tablet in half to see if this helps and the medicine may be stronger dose from this company. Patient notified and she understood and she will be seen in office on next Wednesday.

## 2023-11-24 ENCOUNTER — Other Ambulatory Visit: Payer: Self-pay | Admitting: Nurse Practitioner

## 2023-11-24 ENCOUNTER — Ambulatory Visit: Admitting: Nurse Practitioner

## 2023-11-24 DIAGNOSIS — E1165 Type 2 diabetes mellitus with hyperglycemia: Secondary | ICD-10-CM | POA: Diagnosis not present

## 2023-11-24 DIAGNOSIS — E538 Deficiency of other specified B group vitamins: Secondary | ICD-10-CM | POA: Diagnosis not present

## 2023-11-24 DIAGNOSIS — E559 Vitamin D deficiency, unspecified: Secondary | ICD-10-CM | POA: Diagnosis not present

## 2023-11-24 DIAGNOSIS — E782 Mixed hyperlipidemia: Secondary | ICD-10-CM | POA: Diagnosis not present

## 2023-11-25 LAB — B12 AND FOLATE PANEL
Folate: 7.7 ng/mL (ref >3.0)
Vitamin B-12: 282 pg/mL (ref 232–1245)

## 2023-11-25 LAB — COMPREHENSIVE METABOLIC PANEL WITH GFR
ALT: 19 IU/L (ref 0–32)
AST: 25 IU/L (ref 0–40)
Albumin: 4.7 g/dL (ref 3.9–4.9)
Alkaline Phosphatase: 50 IU/L (ref 44–121)
BUN/Creatinine Ratio: 16 (ref 12–28)
BUN: 13 mg/dL (ref 8–27)
Bilirubin Total: 0.3 mg/dL (ref 0.0–1.2)
CO2: 21 mmol/L (ref 20–29)
Calcium: 9.7 mg/dL (ref 8.7–10.3)
Chloride: 100 mmol/L (ref 96–106)
Creatinine, Ser: 0.8 mg/dL (ref 0.57–1.00)
Globulin, Total: 3.3 g/dL (ref 1.5–4.5)
Glucose: 120 mg/dL — ABNORMAL HIGH (ref 70–99)
Potassium: 4.4 mmol/L (ref 3.5–5.2)
Sodium: 139 mmol/L (ref 134–144)
Total Protein: 8 g/dL (ref 6.0–8.5)
eGFR: 80 mL/min/1.73 (ref >59)

## 2023-11-25 LAB — LIPID PANEL
Chol/HDL Ratio: 4.3 ratio (ref 0.0–4.4)
Cholesterol, Total: 188 mg/dL (ref 100–199)
HDL: 44 mg/dL (ref >39)
LDL Chol Calc (NIH): 128 mg/dL — ABNORMAL HIGH (ref 0–99)
Triglycerides: 89 mg/dL (ref 0–149)
VLDL Cholesterol Cal: 16 mg/dL (ref 5–40)

## 2023-11-25 LAB — CBC WITH DIFFERENTIAL/PLATELET
Basophils Absolute: 0.1 x10E3/uL (ref 0.0–0.2)
Basos: 1 %
EOS (ABSOLUTE): 0.1 x10E3/uL (ref 0.0–0.4)
Eos: 1 %
Hematocrit: 40.6 % (ref 34.0–46.6)
Hemoglobin: 13.1 g/dL (ref 11.1–15.9)
Immature Grans (Abs): 0 x10E3/uL (ref 0.0–0.1)
Immature Granulocytes: 0 %
Lymphocytes Absolute: 2.6 x10E3/uL (ref 0.7–3.1)
Lymphs: 59 %
MCH: 28 pg (ref 26.6–33.0)
MCHC: 32.3 g/dL (ref 31.5–35.7)
MCV: 87 fL (ref 79–97)
Monocytes Absolute: 0.5 x10E3/uL (ref 0.1–0.9)
Monocytes: 10 %
Neutrophils Absolute: 1.3 x10E3/uL — ABNORMAL LOW (ref 1.4–7.0)
Neutrophils: 29 %
Platelets: 282 x10E3/uL (ref 150–450)
RBC: 4.68 x10E6/uL (ref 3.77–5.28)
RDW: 13.4 % (ref 11.7–15.4)
WBC: 4.4 x10E3/uL (ref 3.4–10.8)

## 2023-11-25 LAB — VITAMIN D 25 HYDROXY (VIT D DEFICIENCY, FRACTURES): Vit D, 25-Hydroxy: 22.3 ng/mL — ABNORMAL LOW (ref 30.0–100.0)

## 2023-11-29 ENCOUNTER — Ambulatory Visit: Admitting: Nurse Practitioner

## 2023-11-29 ENCOUNTER — Encounter: Payer: Self-pay | Admitting: Nurse Practitioner

## 2023-11-29 VITALS — BP 135/80 | HR 90 | Temp 97.8°F | Resp 16 | Ht 64.5 in | Wt 150.0 lb

## 2023-11-29 DIAGNOSIS — Z23 Encounter for immunization: Secondary | ICD-10-CM | POA: Diagnosis not present

## 2023-11-29 DIAGNOSIS — E559 Vitamin D deficiency, unspecified: Secondary | ICD-10-CM | POA: Diagnosis not present

## 2023-11-29 DIAGNOSIS — E1169 Type 2 diabetes mellitus with other specified complication: Secondary | ICD-10-CM

## 2023-11-29 DIAGNOSIS — E1159 Type 2 diabetes mellitus with other circulatory complications: Secondary | ICD-10-CM

## 2023-11-29 DIAGNOSIS — E785 Hyperlipidemia, unspecified: Secondary | ICD-10-CM | POA: Diagnosis not present

## 2023-11-29 DIAGNOSIS — I152 Hypertension secondary to endocrine disorders: Secondary | ICD-10-CM

## 2023-11-29 MED ORDER — ERGOCALCIFEROL 1.25 MG (50000 UT) PO CAPS: 50000.0000 [IU] | ORAL_CAPSULE | ORAL | 2 refills | Status: DC

## 2023-11-29 MED ORDER — COVID-19 SUBUNIT VACC-NOVAVAX 5 MCG/0.5ML IM SUSP: 5.0000 ug | Freq: Once | INTRAMUSCULAR | 0 refills | Status: DC | PRN

## 2023-11-29 NOTE — Progress Notes (Signed)
 [facility] [address] [place], [place] [address]  Internal MEDICINE  Office Visit Note  Patient Name: Adrienne Brown  [id number]  [id number]  Date of Service: 11/29/2023  Chief Complaint  Patient presents with   Diabetes   Hypertension   Follow-up    HPI Adrienne Brown presents for a follow-up visit for diabetes, hypertension, high cholesterol, and low vitamin D  Diabetes -- last A1c was 6.3 which is stable. Her pharmacy switched manufacturers and she was having a lot of GI side effects like belching, gas, bloating, nausea, and diarrhea.  New covid strain -- wants to get the new covid vaccine Hypertension -- controlled with lisinopril  High cholesterol -- not currently on any statin therapy.     Current Medication: Outpatient Encounter Medications as of 11/29/2023  Medication Sig   COVID-19 adjuvanted vaccine, NOVAVAX, 5 MCG/0.5ML injection Inject 0.5 mLs into the muscle once as needed for up to 1 dose.   ergocalciferol  (VITAMIN D2) 1.25 MG (50000 UT) capsule Take 1 capsule (50,000 Units total) by mouth once a week.   glucose blood (PRECISION QID TEST) test strip Check sugar once daily   Lancets MISC Check sugar once daily   lisinopril  (ZESTRIL ) 10 MG tablet Take 1 tablet (10 mg total) by mouth daily.   metFORMIN  (GLUCOPHAGE ) 500 MG tablet TAKE 1 TABLET BY MOUTH EVERY DAY WITH BREAKFAST   pneumococcal 20-valent conjugate vaccine (PREVNAR 20) 0.5 ML injection Inject 0.5 mLs into the muscle once as needed for up to 1 dose for immunization.   Zoster Vaccine Adjuvanted [facility]) injection Inject 0.5 mLs into the muscle once as needed for up to 1 dose (shingles vaccination).   [DISCONTINUED] ergocalciferol  (VITAMIN D2) 1.25 MG (50000 UT) capsule Take 1 capsule (50,000 Units total) by mouth once a week.   No facility-administered encounter medications on file as of 11/29/2023.    Surgical History: Past Surgical History:  Procedure Laterality Date   COLONOSCOPY WITH  PROPOFOL  N/A 09/12/2021   Procedure: COLONOSCOPY WITH PROPOFOL ;  Surgeon: Therisa Bi, MD;  Location: [facility] ENDOSCOPY;  Service: Gastroenterology;  Laterality: N/A;   GALLBLADDER SURGERY  2013    Medical History: Past Medical History:  Diagnosis Date   Hypertension    Type II diabetes mellitus (HCC)     Family History: Family History  Problem Relation Age of Onset   Hyperthyroidism Mother        22 yrs old, smokes cigs and alcohol   Hypertension Mother    Diabetes Mother    Diabetes Father    Intestinal malrotation Father        intestinal blockage   Dementia Father     Social History   Socioeconomic History   Marital status: Unknown    Spouse name: Not on file   Number of children: Not on file   Years of education: Not on file   Highest education level: Not on file  Occupational History   Not on file  Tobacco Use   Smoking status: Former    Current packs/day: 0.00    Types: Cigarettes    Quit date: 03/23/2004    Years since quitting: 19.6   Smokeless tobacco: Former   Tobacco comments:    does nicorett at times, quit 2006  Vaping Use   Vaping status: Never Used  Substance and Sexual Activity   Alcohol use: Not Currently    Alcohol/week: 2.0 standard drinks of alcohol    Types: 2 Glasses of wine per week    Comment:  occ   Drug use: Never   Sexual activity: Not on file  Other Topics Concern   Not on file  Social History Narrative   Not on file   Social Drivers of Health   Financial Resource Strain: Not on file  Food Insecurity: Not on file  Transportation Needs: Not on file  Physical Activity: Not on file  Stress: Not on file  Social Connections: Not on file  Intimate Partner Violence: Not on file      Review of Systems  Constitutional:  Negative for chills, fatigue and unexpected weight change.  HENT:  Negative for congestion, rhinorrhea, sneezing and sore throat.   Eyes:  Negative for redness.  Respiratory:  Negative for cough, chest tightness  and shortness of breath.   Cardiovascular:  Negative for chest pain and palpitations.  Gastrointestinal:  Negative for abdominal pain, constipation, diarrhea, nausea and vomiting.  Genitourinary:  Negative for dysuria and frequency.  Musculoskeletal:  Negative for arthralgias, back pain, joint swelling and neck pain.  Skin:  Negative for rash.  Neurological: Negative.  Negative for tremors and numbness.  Hematological:  Negative for adenopathy. Does not bruise/bleed easily.  Psychiatric/Behavioral:  Negative for behavioral problems (Depression), sleep disturbance and suicidal ideas. The patient is not nervous/anxious.     Vital Signs: BP 135/80   Pulse 90   Temp 97.8 F (36.6 C)   Resp 16   Ht 5' 4.5 (1.638 m)   Wt 150 lb (68 kg)   SpO2 98%   BMI 25.35 kg/m    Physical Exam Vitals reviewed.  Constitutional:      General: She is not in acute distress.    Appearance: Normal appearance. She is not ill-appearing.  HENT:     Head: Normocephalic and atraumatic.  Eyes:     Pupils: Pupils are equal, round, and reactive to light.  Cardiovascular:     Rate and Rhythm: Normal rate and regular rhythm.  Pulmonary:     Effort: Pulmonary effort is normal. No respiratory distress.  Neurological:     Mental Status: She is alert and oriented to person, place, and time.  Psychiatric:        Mood and Affect: Mood normal.        Behavior: Behavior normal.        Assessment/Plan: 1. Type 2 diabetes mellitus with other specified complication, without long-term current use of insulin (HCC) (Primary) Continue diet modifications as discussed. Continue metformin  as prescribed. Urine sent for microalbumin/creatinine ratio.  - Urine Microalbumin w/creat. ratio  2. Hypertension associated with diabetes (HCC) Stable, continue lisinopril  as prescribed.   3. Hyperlipidemia associated with type 2 diabetes mellitus (HCC) Limit red meat intake, increase lean protein intake and add OTC fish oil  supplement if desired.   4. Vitamin D  deficiency Continue weekly supplement as prescribed.  - ergocalciferol  (VITAMIN D2) 1.25 MG (50000 UT) capsule; Take 1 capsule (50,000 Units total) by mouth once a week.  Dispense: 4 capsule; Refill: 2  5. Need for COVID-19 vaccine - COVID-19 adjuvanted vaccine, NOVAVAX, 5 MCG/0.5ML injection; Inject 0.5 mLs into the muscle once as needed for up to 1 dose.  Dispense: 0.5 mL; Refill: 0   General Counseling: Hadar verbalizes understanding of the findings of todays visit and agrees with plan of treatment. I have discussed any further diagnostic evaluation that may be needed or ordered today. We also reviewed her medications today. she has been encouraged to call the office with any questions or concerns that should  arise related to todays visit.    Orders Placed This Encounter  Procedures   Urine Microalbumin w/creat. ratio    Meds ordered this encounter  Medications   COVID-19 adjuvanted vaccine, NOVAVAX, 5 MCG/0.5ML injection    Sig: Inject 0.5 mLs into the muscle once as needed for up to 1 dose.    Dispense:  0.5 mL    Refill:  0    Please use current strain available   ergocalciferol  (VITAMIN D2) 1.25 MG (50000 UT) capsule    Sig: Take 1 capsule (50,000 Units total) by mouth once a week.    Dispense:  4 capsule    Refill:  2    Return in about 3 months (around 02/28/2024) for F/U, Recheck A1C, Kamika Goodloe PCP.   Total time spent:30 Minutes Time spent includes review of chart, medications, test results, and follow up plan with the patient.   [place] Controlled Substance Database was reviewed by me.  This patient was seen by Mardy Maxin, FNP-C in collaboration with Dr. Sigrid Bathe as a part of collaborative care agreement.   Lamis Behrmann R. Maxin, MSN, FNP-C Internal medicine

## 2023-11-30 LAB — MICROALBUMIN / CREATININE URINE RATIO
Creatinine, Urine: 22.6 mg/dL
Microalb/Creat Ratio: <13 mg/g{creat} (ref 0–29)
Microalbumin, Urine: <3 ug/mL

## 2023-12-03 ENCOUNTER — Encounter: Payer: Self-pay | Admitting: Nurse Practitioner

## 2024-02-18 ENCOUNTER — Other Ambulatory Visit: Payer: Self-pay | Admitting: Nurse Practitioner

## 2024-02-18 DIAGNOSIS — E559 Vitamin D deficiency, unspecified: Secondary | ICD-10-CM

## 2024-03-01 ENCOUNTER — Ambulatory Visit: Admitting: Nurse Practitioner

## 2024-03-01 ENCOUNTER — Encounter: Payer: Self-pay | Admitting: Nurse Practitioner

## 2024-03-01 VITALS — BP 138/70 | HR 88 | Temp 97.5°F | Resp 16 | Ht 64.5 in | Wt 152.6 lb

## 2024-03-01 DIAGNOSIS — Z5320 Procedure and treatment not carried out because of patient's decision for unspecified reasons: Secondary | ICD-10-CM

## 2024-03-01 DIAGNOSIS — E1169 Type 2 diabetes mellitus with other specified complication: Secondary | ICD-10-CM

## 2024-03-01 DIAGNOSIS — E1159 Type 2 diabetes mellitus with other circulatory complications: Secondary | ICD-10-CM

## 2024-03-01 DIAGNOSIS — E785 Hyperlipidemia, unspecified: Secondary | ICD-10-CM | POA: Diagnosis not present

## 2024-03-01 DIAGNOSIS — I152 Hypertension secondary to endocrine disorders: Secondary | ICD-10-CM | POA: Diagnosis not present

## 2024-03-01 LAB — POCT GLYCOSYLATED HEMOGLOBIN (HGB A1C): Hemoglobin A1C: 6.5 % — AB (ref 4.0–5.6)

## 2024-03-01 NOTE — Progress Notes (Unsigned)
 [facility] [address] [place], [place] [address]  Internal MEDICINE  Office Visit Note  Patient Name: Adrienne Brown  [id number]  [id number]  Date of Service: 03/01/2024  Chief Complaint  Patient presents with   Diabetes   Hypertension   Follow-up    HPI Adrienne Brown presents for a follow-up visit for  Diabetes -- A1c is slightly elevated at 6.5 but still stable.  High cholesterol -- LDL is 128, the rest of the cholesterol panel is normal.     Current Medication: Outpatient Encounter Medications as of 03/01/2024  Medication Sig   glucose blood (PRECISION QID TEST) test strip Check sugar once daily   Lancets MISC Check sugar once daily   lisinopril  (ZESTRIL ) 10 MG tablet Take 1 tablet (10 mg total) by mouth daily.   metFORMIN  (GLUCOPHAGE ) 500 MG tablet TAKE 1 TABLET BY MOUTH EVERY DAY WITH BREAKFAST   Vitamin D , Ergocalciferol , (DRISDOL ) 1.25 MG (50000 UNIT) CAPS capsule TAKE 1 CAPSULE BY MOUTH ONE TIME PER WEEK   [DISCONTINUED] COVID-19 adjuvanted vaccine, NOVAVAX, 5 MCG/0.5ML injection Inject 0.5 mLs into the muscle once as needed for up to 1 dose.   [DISCONTINUED] pneumococcal 20-valent conjugate vaccine (PREVNAR 20) 0.5 ML injection Inject 0.5 mLs into the muscle once as needed for up to 1 dose for immunization.   [DISCONTINUED] Zoster Vaccine Adjuvanted [facility]) injection Inject 0.5 mLs into the muscle once as needed for up to 1 dose (shingles vaccination).   No facility-administered encounter medications on file as of 03/01/2024.    Surgical History: Past Surgical History:  Procedure Laterality Date   COLONOSCOPY WITH PROPOFOL  N/A 09/12/2021   Procedure: COLONOSCOPY WITH PROPOFOL ;  Surgeon: Therisa Bi, MD;  Location: [facility] ENDOSCOPY;  Service: Gastroenterology;  Laterality: N/A;   GALLBLADDER SURGERY  2013    Medical History: Past Medical History:  Diagnosis Date   Hypertension    Type II diabetes mellitus (HCC)     Family History: Family  History  Problem Relation Age of Onset   Hyperthyroidism Mother        70 yrs old, smokes cigs and alcohol   Hypertension Mother    Diabetes Mother    Diabetes Father    Intestinal malrotation Father        intestinal blockage   Dementia Father     Social History   Socioeconomic History   Marital status: Unknown    Spouse name: Not on file   Number of children: Not on file   Years of education: Not on file   Highest education level: Not on file  Occupational History   Not on file  Tobacco Use   Smoking status: Former    Current packs/day: 0.00    Types: Cigarettes    Quit date: 03/23/2004    Years since quitting: 19.9   Smokeless tobacco: Former   Tobacco comments:    does nicorett at times, quit 2006  Vaping Use   Vaping status: Never Used  Substance and Sexual Activity   Alcohol use: Not Currently    Alcohol/week: 2.0 standard drinks of alcohol    Types: 2 Glasses of wine per week    Comment: occ   Drug use: Never   Sexual activity: Not on file  Other Topics Concern   Not on file  Social History Narrative   Not on file   Social Drivers of Health   Financial Resource Strain: Not on file  Food Insecurity: Not on file  Transportation Needs: Not on  file  Physical Activity: Not on file  Stress: Not on file  Social Connections: Not on file  Intimate Partner Violence: Not on file      Review of Systems  Vital Signs: BP 138/70 (BP Location: Left Arm, Patient Position: Sitting, Cuff Size: Normal) Comment: 154/80  Pulse 88   Temp (!) 97.5 F (36.4 C)   Resp 16   Ht 5' 4.5 (1.638 m)   Wt 152 lb 9.6 oz (69.2 kg)   SpO2 99%   BMI 25.79 kg/m    Physical Exam     Assessment/Plan:   General Counseling: Adrienne Brown verbalizes understanding of the findings of todays visit and agrees with plan of treatment. I have discussed any further diagnostic evaluation that may be needed or ordered today. We also reviewed her medications today. she has been encouraged  to call the office with any questions or concerns that should arise related to todays visit.    Orders Placed This Encounter  Procedures   POCT glycosylated hemoglobin (Hb A1C)    No orders of the defined types were placed in this encounter.   Return in about 4 months (around 06/30/2024) for F/U, Recheck A1C, Adrienne Brown PCP.   Total time spent:*** Minutes Time spent includes review of chart, medications, test results, and follow up plan with the patient.   [place] Controlled Substance Database was reviewed by me.  This patient was seen by Mardy Maxin, FNP-C in collaboration with Dr. Sigrid Bathe as a part of collaborative care agreement.   Jadi Deyarmin R. Maxin, MSN, FNP-C Internal medicine

## 2024-03-03 ENCOUNTER — Encounter: Payer: Self-pay | Admitting: Nurse Practitioner

## 2024-03-13 ENCOUNTER — Telehealth: Payer: Self-pay | Admitting: Nurse Practitioner

## 2024-03-13 NOTE — Telephone Encounter (Signed)
 Received C-SNP from [facility]. Gave to Alyssa-Toni

## 2024-03-14 ENCOUNTER — Encounter: Payer: Self-pay | Admitting: Nurse Practitioner

## 2024-03-14 ENCOUNTER — Telehealth: Payer: Self-pay | Admitting: Nurse Practitioner

## 2024-03-14 NOTE — Telephone Encounter (Signed)
 C-SNP signed & faxed back to [facility]; [phone]. Scanned-Toni

## 2024-04-05 ENCOUNTER — Other Ambulatory Visit: Payer: Self-pay | Admitting: Nurse Practitioner

## 2024-04-05 DIAGNOSIS — E1159 Type 2 diabetes mellitus with other circulatory complications: Secondary | ICD-10-CM

## 2024-07-03 ENCOUNTER — Ambulatory Visit: Admitting: Nurse Practitioner

## 2024-08-24 ENCOUNTER — Ambulatory Visit: Admitting: Nurse Practitioner
